# Patient Record
Sex: Female | Born: 1940 | Race: White | Hispanic: No | Smoking: Former smoker
Health system: Southern US, Community
[De-identification: ages and names within clinical notes are randomized; demographics above are authoritative.]

## PROBLEM LIST (undated history)

## (undated) DIAGNOSIS — F0390 Unspecified dementia without behavioral disturbance: Secondary | ICD-10-CM

## (undated) DIAGNOSIS — R519 Headache, unspecified: Secondary | ICD-10-CM

## (undated) DIAGNOSIS — N390 Urinary tract infection, site not specified: Secondary | ICD-10-CM

## (undated) DIAGNOSIS — C801 Malignant (primary) neoplasm, unspecified: Secondary | ICD-10-CM

## (undated) DIAGNOSIS — R490 Dysphonia: Secondary | ICD-10-CM

## (undated) DIAGNOSIS — M549 Dorsalgia, unspecified: Secondary | ICD-10-CM

## (undated) DIAGNOSIS — G8929 Other chronic pain: Secondary | ICD-10-CM

## (undated) DIAGNOSIS — I10 Essential (primary) hypertension: Secondary | ICD-10-CM

## (undated) DIAGNOSIS — M1990 Unspecified osteoarthritis, unspecified site: Secondary | ICD-10-CM

## (undated) HISTORY — PX: SHOULDER ARTHROSCOPY WITH OPEN ROTATOR CUFF REPAIR: SHX6092

## (undated) HISTORY — PX: LUMBAR FUSION: SHX111

## (undated) HISTORY — PX: DILATION AND CURETTAGE OF UTERUS: SHX78

---

## 1998-01-08 ENCOUNTER — Other Ambulatory Visit: Admission: RE | Admit: 1998-01-08 | Discharge: 1998-01-08 | Payer: Self-pay | Admitting: Obstetrics and Gynecology

## 1999-02-22 ENCOUNTER — Other Ambulatory Visit: Admission: RE | Admit: 1999-02-22 | Discharge: 1999-02-22 | Payer: Self-pay | Admitting: Obstetrics and Gynecology

## 1999-12-03 ENCOUNTER — Encounter: Payer: Self-pay | Admitting: Obstetrics and Gynecology

## 1999-12-03 ENCOUNTER — Encounter: Admission: RE | Admit: 1999-12-03 | Discharge: 1999-12-03 | Payer: Self-pay | Admitting: Obstetrics and Gynecology

## 2000-05-06 ENCOUNTER — Ambulatory Visit (HOSPITAL_COMMUNITY): Admission: RE | Admit: 2000-05-06 | Discharge: 2000-05-06 | Payer: Self-pay | Admitting: Gastroenterology

## 2000-06-25 ENCOUNTER — Encounter: Payer: Self-pay | Admitting: Family Medicine

## 2000-06-25 ENCOUNTER — Encounter: Admission: RE | Admit: 2000-06-25 | Discharge: 2000-06-25 | Payer: Self-pay | Admitting: Family Medicine

## 2000-12-30 ENCOUNTER — Encounter: Payer: Self-pay | Admitting: Family Medicine

## 2000-12-30 ENCOUNTER — Encounter: Admission: RE | Admit: 2000-12-30 | Discharge: 2000-12-30 | Payer: Self-pay | Admitting: Family Medicine

## 2001-02-16 ENCOUNTER — Encounter: Admission: RE | Admit: 2001-02-16 | Discharge: 2001-02-16 | Payer: Self-pay | Admitting: Orthopedic Surgery

## 2001-02-16 ENCOUNTER — Encounter: Payer: Self-pay | Admitting: Orthopedic Surgery

## 2001-03-03 ENCOUNTER — Encounter: Payer: Self-pay | Admitting: Orthopedic Surgery

## 2001-03-03 ENCOUNTER — Encounter: Admission: RE | Admit: 2001-03-03 | Discharge: 2001-03-03 | Payer: Self-pay | Admitting: Orthopedic Surgery

## 2001-03-17 ENCOUNTER — Encounter: Payer: Self-pay | Admitting: Orthopedic Surgery

## 2001-03-17 ENCOUNTER — Encounter: Admission: RE | Admit: 2001-03-17 | Discharge: 2001-03-17 | Payer: Self-pay | Admitting: Orthopedic Surgery

## 2001-04-08 ENCOUNTER — Other Ambulatory Visit: Admission: RE | Admit: 2001-04-08 | Discharge: 2001-04-08 | Payer: Self-pay | Admitting: Obstetrics and Gynecology

## 2002-02-02 ENCOUNTER — Encounter: Admission: RE | Admit: 2002-02-02 | Discharge: 2002-02-02 | Payer: Self-pay | Admitting: Family Medicine

## 2002-02-02 ENCOUNTER — Encounter: Payer: Self-pay | Admitting: Family Medicine

## 2002-10-29 ENCOUNTER — Ambulatory Visit (HOSPITAL_COMMUNITY): Admission: RE | Admit: 2002-10-29 | Discharge: 2002-10-29 | Payer: Self-pay | Admitting: *Deleted

## 2002-11-08 ENCOUNTER — Encounter: Admission: RE | Admit: 2002-11-08 | Discharge: 2002-11-08 | Payer: Self-pay | Admitting: Family Medicine

## 2002-11-08 ENCOUNTER — Encounter: Payer: Self-pay | Admitting: Family Medicine

## 2003-01-25 ENCOUNTER — Encounter: Payer: Self-pay | Admitting: Emergency Medicine

## 2003-01-25 ENCOUNTER — Emergency Department (HOSPITAL_COMMUNITY): Admission: AD | Admit: 2003-01-25 | Discharge: 2003-01-25 | Payer: Self-pay | Admitting: Emergency Medicine

## 2003-02-07 ENCOUNTER — Encounter: Admission: RE | Admit: 2003-02-07 | Discharge: 2003-02-07 | Payer: Self-pay | Admitting: Family Medicine

## 2003-02-07 ENCOUNTER — Encounter: Payer: Self-pay | Admitting: Family Medicine

## 2003-06-28 ENCOUNTER — Ambulatory Visit (HOSPITAL_COMMUNITY): Admission: RE | Admit: 2003-06-28 | Discharge: 2003-06-28 | Payer: Self-pay | Admitting: Gastroenterology

## 2003-11-22 ENCOUNTER — Encounter: Admission: RE | Admit: 2003-11-22 | Discharge: 2004-02-20 | Payer: Self-pay | Admitting: Family Medicine

## 2004-02-13 ENCOUNTER — Encounter: Admission: RE | Admit: 2004-02-13 | Discharge: 2004-02-13 | Payer: Self-pay | Admitting: Family Medicine

## 2004-02-20 ENCOUNTER — Encounter: Admission: RE | Admit: 2004-02-20 | Discharge: 2004-05-20 | Payer: Self-pay | Admitting: Family Medicine

## 2004-07-31 ENCOUNTER — Other Ambulatory Visit: Admission: RE | Admit: 2004-07-31 | Discharge: 2004-07-31 | Payer: Self-pay | Admitting: Family Medicine

## 2005-03-14 ENCOUNTER — Encounter: Admission: RE | Admit: 2005-03-14 | Discharge: 2005-03-14 | Payer: Self-pay | Admitting: Family Medicine

## 2006-03-18 ENCOUNTER — Encounter: Admission: RE | Admit: 2006-03-18 | Discharge: 2006-03-18 | Payer: Self-pay | Admitting: Family Medicine

## 2006-06-24 ENCOUNTER — Encounter: Admission: RE | Admit: 2006-06-24 | Discharge: 2006-06-24 | Payer: Self-pay | Admitting: Orthopedic Surgery

## 2006-11-24 ENCOUNTER — Other Ambulatory Visit: Admission: RE | Admit: 2006-11-24 | Discharge: 2006-11-24 | Payer: Self-pay | Admitting: Obstetrics and Gynecology

## 2007-06-11 ENCOUNTER — Encounter: Admission: RE | Admit: 2007-06-11 | Discharge: 2007-06-11 | Payer: Self-pay | Admitting: Family Medicine

## 2008-02-23 ENCOUNTER — Encounter: Admission: RE | Admit: 2008-02-23 | Discharge: 2008-02-23 | Payer: Self-pay | Admitting: Family Medicine

## 2008-07-03 ENCOUNTER — Encounter: Admission: RE | Admit: 2008-07-03 | Discharge: 2008-07-03 | Payer: Self-pay | Admitting: Family Medicine

## 2009-08-24 ENCOUNTER — Encounter: Admission: RE | Admit: 2009-08-24 | Discharge: 2009-08-24 | Payer: Self-pay | Admitting: Family Medicine

## 2010-08-27 ENCOUNTER — Encounter: Admission: RE | Admit: 2010-08-27 | Discharge: 2010-08-27 | Payer: Self-pay | Admitting: Family Medicine

## 2011-02-28 NOTE — Op Note (Signed)
   NAME:  Tina Price, Tina Price                        ACCOUNT NO.:  [id number]   MEDICAL RECORD NO.:  [id number]                   PATIENT TYPE:  AMB   LOCATION:  ENDO                                 FACILITY:  [facility]   PHYSICIAN:  John C. Madilyn Fireman, M.D.                 DATE OF BIRTH:  [birth year]   DATE OF PROCEDURE:  DATE OF DISCHARGE:                                 OPERATIVE REPORT   PROCEDURE:  Colonoscopy.   INDICATIONS FOR PROCEDURE:  History of adenomatous colon polyps and a family  history of colon cancer in one first and one second degree relative.   DESCRIPTION OF PROCEDURE:  The patient was placed in the left lateral  decubitus position then placed on the pulse monitor with continuous low flow  oxygen delivered by nasal cannula. She was sedated with 100 mcg IV fentanyl  and 8 mg IV Versed. The Olympus video colonoscope was inserted into the  rectum and advanced to the cecum, confirmed by transillumination at  McBurney's point and visualization at the ileocecal valve and appendiceal  orifice. The prep was excellent. The cecum, ascending, transverse and  descending colon all appeared normal with no masses, polyps, diverticula or  other mucosal abnormalities. Within the sigmoid colon, there were seen a few  scattered diverticula but no other abnormalities. The rectum appeared normal  and retroflexed view of the anus revealed no obvious internal hemorrhoids.  The scope was then withdrawn and the patient returned to the recovery room  in stable condition. She tolerated the procedure well and there were no  immediate complications.   IMPRESSION:  Scattered diverticula otherwise normal colonoscopy.   PLAN:  Repeat colonoscopy in five years based on her personal history of  polyps and family history of colon cancer.                                               John C. Madilyn Fireman, M.D.    [facility]MED[facility]  D:  06/28/2003  T:  06/28/2003  Job:  [id number]   cc:   Miguel Aschoff, M.D.  [address]  [place]  [place]  [phone]  Fax: [phone]

## 2011-07-11 ENCOUNTER — Other Ambulatory Visit: Payer: Self-pay | Admitting: Family Medicine

## 2011-07-11 ENCOUNTER — Other Ambulatory Visit (HOSPITAL_COMMUNITY)
Admission: RE | Admit: 2011-07-11 | Discharge: 2011-07-11 | Disposition: A | Discharge status: Home / Self Care | Payer: Medicare Other | Source: Ambulatory Visit | Attending: Family Medicine | Admitting: Family Medicine

## 2011-07-11 DIAGNOSIS — Z124 Encounter for screening for malignant neoplasm of cervix: Secondary | ICD-10-CM | POA: Insufficient documentation

## 2011-09-16 ENCOUNTER — Other Ambulatory Visit: Payer: Self-pay | Admitting: Family Medicine

## 2011-09-16 DIAGNOSIS — Z1231 Encounter for screening mammogram for malignant neoplasm of breast: Secondary | ICD-10-CM

## 2011-10-22 ENCOUNTER — Ambulatory Visit: Payer: Medicare Other

## 2011-11-25 ENCOUNTER — Ambulatory Visit
Admission: RE | Admit: 2011-11-25 | Discharge: 2011-11-25 | Disposition: A | Discharge status: Home / Self Care | Payer: Medicare Other | Source: Ambulatory Visit | Attending: Family Medicine | Admitting: Family Medicine

## 2011-11-25 DIAGNOSIS — Z1231 Encounter for screening mammogram for malignant neoplasm of breast: Secondary | ICD-10-CM

## 2012-08-22 ENCOUNTER — Encounter (HOSPITAL_COMMUNITY): Payer: Self-pay | Admitting: Emergency Medicine

## 2012-08-22 ENCOUNTER — Emergency Department (HOSPITAL_COMMUNITY)
Admission: EM | Admit: 2012-08-22 | Discharge: 2012-08-22 | Disposition: A | Discharge status: Home / Self Care | Payer: Medicare Other | Attending: Emergency Medicine | Admitting: Emergency Medicine

## 2012-08-22 DIAGNOSIS — N390 Urinary tract infection, site not specified: Secondary | ICD-10-CM

## 2012-08-22 DIAGNOSIS — R350 Frequency of micturition: Secondary | ICD-10-CM | POA: Insufficient documentation

## 2012-08-22 DIAGNOSIS — R269 Unspecified abnormalities of gait and mobility: Secondary | ICD-10-CM | POA: Insufficient documentation

## 2012-08-22 DIAGNOSIS — M5416 Radiculopathy, lumbar region: Secondary | ICD-10-CM

## 2012-08-22 DIAGNOSIS — R5383 Other fatigue: Secondary | ICD-10-CM | POA: Insufficient documentation

## 2012-08-22 DIAGNOSIS — Z79899 Other long term (current) drug therapy: Secondary | ICD-10-CM | POA: Insufficient documentation

## 2012-08-22 DIAGNOSIS — M129 Arthropathy, unspecified: Secondary | ICD-10-CM | POA: Insufficient documentation

## 2012-08-22 DIAGNOSIS — R5381 Other malaise: Secondary | ICD-10-CM | POA: Insufficient documentation

## 2012-08-22 DIAGNOSIS — R209 Unspecified disturbances of skin sensation: Secondary | ICD-10-CM | POA: Insufficient documentation

## 2012-08-22 DIAGNOSIS — G8929 Other chronic pain: Secondary | ICD-10-CM

## 2012-08-22 HISTORY — DX: Other chronic pain: G89.29

## 2012-08-22 HISTORY — DX: Dorsalgia, unspecified: M54.9

## 2012-08-22 HISTORY — DX: Unspecified osteoarthritis, unspecified site: M19.90

## 2012-08-22 LAB — URINALYSIS, ROUTINE W REFLEX MICROSCOPIC
Glucose, UA: NEGATIVE mg/dL
pH: 6.5 (ref 5.0–8.0)

## 2012-08-22 LAB — URINE MICROSCOPIC-ADD ON

## 2012-08-22 MED ORDER — HYDROMORPHONE HCL PF 1 MG/ML IJ SOLN
1.0000 mg | Freq: Once | INTRAMUSCULAR | Status: AC
Start: 2012-08-22 — End: 2012-08-22
  Administered 2012-08-22: 1 mg via INTRAMUSCULAR
  Filled 2012-08-22: qty 1

## 2012-08-22 MED ORDER — NITROFURANTOIN MACROCRYSTAL 100 MG PO CAPS
100.0000 mg | ORAL_CAPSULE | Freq: Four times a day (QID) | ORAL | Status: DC
  Administered 2012-08-22: 100 mg via ORAL
  Filled 2012-08-22 (×2): qty 1

## 2012-08-22 MED ORDER — DIAZEPAM 5 MG PO TABS
5.0000 mg | ORAL_TABLET | Freq: Once | ORAL | Status: AC
  Administered 2012-08-22: 5 mg via ORAL
  Filled 2012-08-22: qty 1

## 2012-08-22 MED ORDER — NITROFURANTOIN MACROCRYSTAL 100 MG PO CAPS: 100.0000 mg | ORAL_CAPSULE | Freq: Four times a day (QID) | ORAL | Status: DC

## 2012-08-22 NOTE — ED Provider Notes (Signed)
Complains of low back pain chronic for several years. Pain similar to back pain which he has been experiencing for years only more severe recently treated with prednisone taper. Patient also taking Vicodin one every 8 hours without adequate pain relief  Doug Sou, MD 08/22/12 2137

## 2012-08-22 NOTE — ED Provider Notes (Signed)
History     CSN: [id number]  Arrival date & time 08/22/12  1851   First MD Initiated Contact with Patient 08/22/12 2052      Chief Complaint  Patient presents with  . Back Pain    (Consider location/radiation/quality/duration/timing/severity/associated sxs/prior treatment) HPI Comments: Pateint with chronic back pain has been worse over the past 10 days was seen by Ortho and started on 12 day prednisone taper as well as Hydrocodone Initial she though she was getting  better but pain has increased over the past 2 days since starting 20 mg Prednisone. She has not lost bowel or bladder control but states her L leg is weaker than normal, but is able to walk with a cane.  Patient is a 71 y.o. female presenting with back pain. The history is provided by the patient.  Back Pain  This is a chronic problem. The current episode started more than 1 week ago. The problem occurs constantly. The problem has been gradually worsening. The pain is associated with no known injury. The pain is present in the lumbar spine. The quality of the pain is described as aching. The pain radiates to the left thigh. The pain is at a severity of 8/10. The pain is moderate. The symptoms are aggravated by certain positions. Associated symptoms include numbness and weakness. Pertinent negatives include no fever, no abdominal pain and no dysuria.    Past Medical History  Diagnosis Date  . Back pain, chronic   . Arthritis     Past Surgical History  Procedure Date  . Sholder arthroscopy with open rotator cuff repair   . Dilation and curettage of uterus     History reviewed. No pertinent family history.  History  Substance Use Topics  . Smoking status: Never Smoker   . Smokeless tobacco: Not on file  . Alcohol Use: No    OB History    Grav Para Term Preterm Abortions TAB SAB Ect Mult Living                  Review of Systems  Constitutional: Negative for fever and chills.  HENT: Negative.     Cardiovascular: Negative for leg swelling.  Gastrointestinal: Negative for abdominal pain, diarrhea and rectal pain.  Genitourinary: Positive for frequency. Negative for dysuria and flank pain.  Musculoskeletal: Positive for back pain and gait problem. Negative for joint swelling.  Skin: Negative for rash and wound.  Neurological: Positive for weakness and numbness.  Hematological: Negative.   Psychiatric/Behavioral: Negative.   All other systems reviewed and are negative.    Allergies  Penicillins and Sulfa antibiotics  Home Medications   Current Outpatient Rx  Name  Route  Sig  Dispense  Refill  . ASPIRIN 81 MG PO TABS   Oral   Take 81 mg by mouth daily.         Marland Kitchen DIAZEPAM 5 MG PO TABS   Oral   Take 5 mg by mouth every 6 (six) hours as needed. Vertigo         . HYDROCHLOROTHIAZIDE 12.5 MG PO CAPS   Oral   Take 12.5 mg by mouth daily.         Marland Kitchen HYDROCODONE-ACETAMINOPHEN 5-325 MG PO TABS   Oral   Take 1 tablet by mouth every 6 (six) hours as needed. Pain         . MELOXICAM 7.5 MG PO TABS   Oral   Take 7.5 mg by mouth daily.         Marland Kitchen  PREDNISONE (PAK) 5 MG PO TABS   Oral   Take 5 mg by mouth daily.         . SERTRALINE HCL 50 MG PO TABS   Oral   Take 50 mg by mouth daily.         Marland Kitchen SIMVASTATIN 40 MG PO TABS   Oral   Take 40 mg by mouth every evening.         Marland Kitchen VERAPAMIL HCL ER 240 MG PO TBCR   Oral   Take 240 mg by mouth at bedtime.         Marland Kitchen NITROFURANTOIN MACROCRYSTAL 100 MG PO CAPS   Oral   Take 1 capsule (100 mg total) by mouth 4 (four) times daily.   19 capsule   0     BP 129/70  Pulse 55  Temp 97.5 F (36.4 C) (Oral)  Resp 12  Wt 160 lb (72.576 kg)  SpO2 98%  Physical Exam  Constitutional: She is oriented to person, place, and time. She appears well-developed and well-nourished.  HENT:  Head: Normocephalic.  Left Ear: External ear normal.  Eyes: Pupils are equal, round, and reactive to light.  Neck: Normal range of  motion.  Cardiovascular: Normal rate and regular rhythm.   Pulmonary/Chest: Effort normal and breath sounds normal.  Abdominal: Soft. She exhibits no distension. There is no tenderness.  Musculoskeletal: Normal range of motion. She exhibits no edema and no tenderness.       Lumbar back: She exhibits tenderness and pain. She exhibits no bony tenderness, no swelling and no spasm.       Back:  Neurological: She is alert and oriented to person, place, and time.  Skin: Skin is warm and dry. No rash noted.    ED Course  Procedures (including critical care time)  Labs Reviewed  URINALYSIS, ROUTINE W REFLEX MICROSCOPIC - Abnormal; Notable for the following:    Hgb urine dipstick MODERATE (*)     Nitrite POSITIVE (*)     Leukocytes, UA SMALL (*)     All other components within normal limits  URINE MICROSCOPIC-ADD ON - Abnormal; Notable for the following:    Bacteria, UA MANY (*)     All other components within normal limits  URINE CULTURE   No results found.   1. UTI (lower urinary tract infection)   2. Chronic radicular low back pain       MDM  Pain control will check UA due to frequency  Patient feeling better after IM medication UA positive for nitrates and bacteria will treat with Macrobid due to allergy profile        Arman Filter, NP 08/23/12 [phone]

## 2012-08-22 NOTE — ED Notes (Signed)
Patient reports that she started to have increased pain to her back with numbness and tingling that woke her up this morning. Patient reports that this is not her normal back pain

## 2012-08-23 ENCOUNTER — Ambulatory Visit (HOSPITAL_COMMUNITY)
Admission: RE | Admit: 2012-08-23 | Discharge: 2012-08-23 | Disposition: A | Discharge status: Home / Self Care | Payer: Medicare Other | Source: Ambulatory Visit | Attending: Emergency Medicine | Admitting: Emergency Medicine

## 2012-08-23 DIAGNOSIS — M5137 Other intervertebral disc degeneration, lumbosacral region: Secondary | ICD-10-CM | POA: Insufficient documentation

## 2012-08-23 DIAGNOSIS — M4120 Other idiopathic scoliosis, site unspecified: Secondary | ICD-10-CM | POA: Insufficient documentation

## 2012-08-23 DIAGNOSIS — M51379 Other intervertebral disc degeneration, lumbosacral region without mention of lumbar back pain or lower extremity pain: Secondary | ICD-10-CM | POA: Insufficient documentation

## 2012-08-24 NOTE — ED Provider Notes (Signed)
Medical screening examination/treatment/procedure(s) were conducted as a shared visit with non-physician practitioner(s) and myself.  I personally evaluated the patient during the encounter  Doug Sou, MD 08/24/12 [phone]

## 2012-08-25 LAB — URINE CULTURE

## 2012-08-26 NOTE — ED Notes (Signed)
+   Urine] Patient treated with Macrobid-sensitive to same-chart appended per protocol MD. 

## 2012-12-09 ENCOUNTER — Other Ambulatory Visit: Payer: Self-pay

## 2012-12-09 DIAGNOSIS — Z1231 Encounter for screening mammogram for malignant neoplasm of breast: Secondary | ICD-10-CM

## 2013-01-03 ENCOUNTER — Ambulatory Visit
Admission: RE | Admit: 2013-01-03 | Discharge: 2013-01-03 | Disposition: A | Discharge status: Home / Self Care | Payer: Medicare Other | Source: Ambulatory Visit

## 2013-01-03 DIAGNOSIS — Z1231 Encounter for screening mammogram for malignant neoplasm of breast: Secondary | ICD-10-CM

## 2013-11-24 ENCOUNTER — Other Ambulatory Visit: Payer: Self-pay | Admitting: Gastroenterology

## 2014-01-12 ENCOUNTER — Other Ambulatory Visit: Payer: Self-pay

## 2014-01-12 DIAGNOSIS — Z1231 Encounter for screening mammogram for malignant neoplasm of breast: Secondary | ICD-10-CM

## 2014-01-23 ENCOUNTER — Ambulatory Visit
Admission: RE | Admit: 2014-01-23 | Discharge: 2014-01-23 | Disposition: A | Discharge status: Home / Self Care | Payer: Medicare Other | Source: Ambulatory Visit

## 2014-01-23 DIAGNOSIS — Z1231 Encounter for screening mammogram for malignant neoplasm of breast: Secondary | ICD-10-CM

## 2014-09-01 ENCOUNTER — Other Ambulatory Visit: Payer: Self-pay | Admitting: Rehabilitation

## 2014-09-01 DIAGNOSIS — M5416 Radiculopathy, lumbar region: Secondary | ICD-10-CM

## 2014-09-04 ENCOUNTER — Ambulatory Visit
Admission: RE | Admit: 2014-09-04 | Discharge: 2014-09-04 | Disposition: A | Discharge status: Home / Self Care | Payer: 59 | Source: Ambulatory Visit | Attending: Rehabilitation | Admitting: Rehabilitation

## 2014-09-04 DIAGNOSIS — M5416 Radiculopathy, lumbar region: Secondary | ICD-10-CM

## 2015-01-08 ENCOUNTER — Other Ambulatory Visit: Payer: Self-pay

## 2015-01-08 MED ORDER — DIAZEPAM 5 MG PO TABS: ORAL_TABLET | ORAL | Status: DC

## 2015-01-08 MED ORDER — HYDROCODONE-ACETAMINOPHEN 10-325 MG PO TABS: ORAL_TABLET | ORAL | Status: DC

## 2015-01-08 NOTE — Telephone Encounter (Signed)
[facility] 

## 2015-01-09 ENCOUNTER — Non-Acute Institutional Stay (SKILLED_NURSING_FACILITY): Payer: Medicare Other | Admitting: Internal Medicine

## 2015-01-09 DIAGNOSIS — J383 Other diseases of vocal cords: Secondary | ICD-10-CM | POA: Diagnosis not present

## 2015-01-09 DIAGNOSIS — N3090 Cystitis, unspecified without hematuria: Secondary | ICD-10-CM

## 2015-01-09 DIAGNOSIS — G43009 Migraine without aura, not intractable, without status migrainosus: Secondary | ICD-10-CM

## 2015-01-09 DIAGNOSIS — N3080 Other cystitis without hematuria: Secondary | ICD-10-CM | POA: Diagnosis not present

## 2015-01-09 DIAGNOSIS — I10 Essential (primary) hypertension: Secondary | ICD-10-CM

## 2015-01-09 DIAGNOSIS — G629 Polyneuropathy, unspecified: Secondary | ICD-10-CM

## 2015-01-09 DIAGNOSIS — M47817 Spondylosis without myelopathy or radiculopathy, lumbosacral region: Secondary | ICD-10-CM | POA: Diagnosis not present

## 2015-01-09 DIAGNOSIS — M4716 Other spondylosis with myelopathy, lumbar region: Secondary | ICD-10-CM

## 2015-01-09 DIAGNOSIS — E785 Hyperlipidemia, unspecified: Secondary | ICD-10-CM

## 2015-01-09 NOTE — Progress Notes (Signed)
Patient ID: Tina Price, female   DOB: 1941/09/27, 74 y.o.   MRN: [id number]     [facility]   PCP: No primary care provider on file.  Code Status: full code  Allergies  Allergen Reactions  . Penicillins Rash  . Sulfa Antibiotics Rash    Chief Complaint  Patient presents with  . New Admit To SNF     HPI:  74 year old patient is here for short term rehabilitation post hospital admission from 01/02/15-01/05/15 with lumbosacral spondylosis with myelopathy and scoliosis. She underwent L4-S1 posterior spinal fusion with instrumentation and incidental durotomy repair.  She has PMH of HTN, kyphoscoilosis, HLD, OA, spasmodic dysphonia and recurrent cystitis. She is seen in her room today. Her back pain is under control. Denies muscle spasm. Denies any concerns  Review of Systems:  Constitutional: Negative for fever, chills, malaise/fatigue and diaphoresis.  HENT: Negative for headache, congestion Eyes: Negative for eye pain, blurred vision, double vision and discharge.  Respiratory: Negative for cough, shortness of breath and wheezing.   Cardiovascular: Negative for chest pain, palpitations, leg swelling.  Gastrointestinal: Negative for heartburn, nausea, vomiting, abdominal pain. Appetite is fair. had a bowel movement yesterday Genitourinary: Negative for dysuria. Has overflow incontinence Musculoskeletal: Negative for falls. Has arthritis and wears brace in right wrist Skin: Negative for itching, rash.  Neurological: Negative for dizziness, tingling, focal weakness Psychiatric/Behavioral: Negative for depression  Past Medical History  Diagnosis Date  . Back pain, chronic   . Arthritis    Past Surgical History  Procedure Laterality Date  . Shoulder arthroscopy with open rotator cuff repair    . Dilation and curettage of uterus     Social History:   reports that she has never smoked. She does not have any smokeless tobacco history on  file. She reports that she does not drink alcohol or use illicit drugs.  No family history on file.  Medications: Patient's Medications  New Prescriptions   No medications on file  Previous Medications   BACLOFEN (LIORESAL) 10 MG TABLET    Take 10 mg by mouth 3 (three) times daily.   CEPHALEXIN (KEFLEX) 250 MG CAPSULE    Take 250 mg by mouth 3 (three) times daily.   DIAZEPAM (VALIUM) 5 MG TABLET    Take one tablet by mouth three times a day as needed   GABAPENTIN (NEURONTIN) 100 MG CAPSULE    Take 100 mg by mouth daily as needed.   GABAPENTIN (NEURONTIN) 100 MG CAPSULE    Take 200 mg by mouth at bedtime.   HYDROCHLOROTHIAZIDE (MICROZIDE) 12.5 MG CAPSULE    Take 12.5 mg by mouth daily.   HYDROCODONE-ACETAMINOPHEN (NORCO) 10-325 MG PER TABLET    Take 1 tablet by mouth every 8 (eight) hours as needed.   ONDANSETRON (ZOFRAN-ODT) 8 MG DISINTEGRATING TABLET    Take 8 mg by mouth every 8 (eight) hours as needed for nausea or vomiting.   SIMVASTATIN (ZOCOR) 40 MG TABLET    Take 40 mg by mouth every evening.   TOPIRAMATE (TOPAMAX) 25 MG TABLET    Take 25 mg by mouth at bedtime as needed.   VERAPAMIL (CALAN-SR) 240 MG CR TABLET    Take 240 mg by mouth at bedtime.  Modified Medications   No medications on file  Discontinued Medications   ASPIRIN 81 MG TABLET    Take 81 mg by mouth daily.   HYDROCODONE-ACETAMINOPHEN (NORCO) 10-325 MG PER TABLET    Take one  tablet by mouth every 6 hours as needed while awake as needed *DO NOT EXCEED 4GM OF TYLENOL IN 24 HOURS*   MELOXICAM (MOBIC) 7.5 MG TABLET    Take 7.5 mg by mouth daily.   NITROFURANTOIN (MACRODANTIN) 100 MG CAPSULE    Take 1 capsule (100 mg total) by mouth 4 (four) times daily.   PREDNISONE (STERAPRED UNI-PAK) 5 MG TABS    Take 5 mg by mouth daily.   SERTRALINE (ZOLOFT) 50 MG TABLET    Take 50 mg by mouth daily.     Physical Exam: Filed Vitals:   01/09/15 1042  BP: 122/68  Pulse: 66  Temp: 98.2 F (36.8 C)  Resp: 18  SpO2: 96%     General- elderly female, in no acute distress Head- normocephalic, atraumatic Throat- moist mucus membrane, has dysphonia Eyes- PERRLA, EOMI, no pallor, no icterus, no discharge, normal conjunctiva, normal sclera Neck- no cervical lymphadenopathy Cardiovascular- normal s1,s2, no murmurs, palpable dorsalis pedis and radial pulses, trace left leg edema Respiratory- bilateral clear to auscultation, no wheeze, no rhonchi, no crackles, no use of accessory muscles Abdomen- bowel sounds present, soft, non tender Musculoskeletal- able to move all 4 extremities, surgical incision in lower back area open to air and healing well. Some right sided paraspinal tenderness on exam, has kyphosis and scoliosis, right wrist in soft brace Neurological- no focal deficit Psychiatry- alert and oriented to person, place and time, normal mood and affect    Labs reviewed: see discharge summary  Assessment/Plan  Lumbosacral spondylosis S/p fusion. Surgical incision looks good. Continue nroco 10-325 mg q8h prn pain. Continue baclofen 10 mg tid for muscle spasm  Recurrent cystitis Currently asymptomatic. On chronic keflex for prophylaxis  Neuropathy Continue neurontin 200 mg daily with additional 100 mg qd prn for pain  HTN bp stable, continue hctz 12.5 mg daily and verapamil 240 mg daily  HLD Continue zocor 40 mg daily  Migraine No headache at present, continue prn topamax  Spasmodic dysphonia Stable, continue valium tid prn and baclofen tid scheduled  Goals of care: short term rehabilitation    Labs/tests ordered- cbc, cmp  Family/ staff Communication: reviewed care plan with patient and nursing supervisor    Blanchie Serve, MD  [facility] [phone] (Monday-Friday 8 am - 5 pm) [phone] (afterhours)

## 2015-01-10 DIAGNOSIS — F411 Generalized anxiety disorder: Secondary | ICD-10-CM | POA: Insufficient documentation

## 2015-01-10 DIAGNOSIS — M4716 Other spondylosis with myelopathy, lumbar region: Secondary | ICD-10-CM | POA: Insufficient documentation

## 2015-01-10 DIAGNOSIS — J383 Other diseases of vocal cords: Secondary | ICD-10-CM | POA: Insufficient documentation

## 2015-01-10 DIAGNOSIS — E785 Hyperlipidemia, unspecified: Secondary | ICD-10-CM | POA: Insufficient documentation

## 2015-01-10 DIAGNOSIS — G43009 Migraine without aura, not intractable, without status migrainosus: Secondary | ICD-10-CM | POA: Insufficient documentation

## 2015-01-10 DIAGNOSIS — N3090 Cystitis, unspecified without hematuria: Secondary | ICD-10-CM | POA: Insufficient documentation

## 2015-01-10 DIAGNOSIS — G629 Polyneuropathy, unspecified: Secondary | ICD-10-CM | POA: Insufficient documentation

## 2015-01-10 DIAGNOSIS — I10 Essential (primary) hypertension: Secondary | ICD-10-CM | POA: Insufficient documentation

## 2015-02-14 ENCOUNTER — Encounter: Payer: Self-pay | Admitting: Adult Health

## 2015-02-14 ENCOUNTER — Non-Acute Institutional Stay (SKILLED_NURSING_FACILITY): Payer: Medicare Other | Admitting: Adult Health

## 2015-02-14 DIAGNOSIS — K5900 Constipation, unspecified: Secondary | ICD-10-CM | POA: Diagnosis not present

## 2015-02-14 DIAGNOSIS — M47817 Spondylosis without myelopathy or radiculopathy, lumbosacral region: Secondary | ICD-10-CM | POA: Diagnosis not present

## 2015-02-14 DIAGNOSIS — E785 Hyperlipidemia, unspecified: Secondary | ICD-10-CM

## 2015-02-14 DIAGNOSIS — J383 Other diseases of vocal cords: Secondary | ICD-10-CM

## 2015-02-14 DIAGNOSIS — G629 Polyneuropathy, unspecified: Secondary | ICD-10-CM | POA: Diagnosis not present

## 2015-02-14 DIAGNOSIS — I10 Essential (primary) hypertension: Secondary | ICD-10-CM

## 2015-02-14 DIAGNOSIS — N3080 Other cystitis without hematuria: Secondary | ICD-10-CM

## 2015-02-14 DIAGNOSIS — N3090 Cystitis, unspecified without hematuria: Secondary | ICD-10-CM

## 2015-02-14 DIAGNOSIS — G43009 Migraine without aura, not intractable, without status migrainosus: Secondary | ICD-10-CM | POA: Diagnosis not present

## 2015-02-14 DIAGNOSIS — M4716 Other spondylosis with myelopathy, lumbar region: Secondary | ICD-10-CM

## 2015-02-14 NOTE — Progress Notes (Signed)
Patient ID: Tina Price, female   DOB: Sep 10, 1941, 74 y.o.   MRN: [id number]   02/14/2015  Facility:  [facility] Home Location:  [place] Room Number: 509-P LEVEL OF CARE:  SNF (31)  Chief Complaint  Patient presents with  . Discharge Note    Lumbosacral spondylosis S/P L4-S1 spinal fusion with instrumentation and incidental durotomy, hypertension, recurrent cystitis, neuropathy, hyperlipidemia, constipation and migraine    HISTORY OF PRESENT ILLNESS:  This is a 74 year old female who is for discharge home with home health PT for strengthening, endurance and gait and OT for self care and home management tasks. DME: Rolling walker and 3 in 1 commode. She has been admitted to [facility] on 01/05/15 from [facility]. She has PMH of hypertension, kyphoscoliosis, hyperlipidemia, osteoarthritis, spasmodic dysphonia and recurrent cystitis. She had L4-S1 spinal fusion with instrumentation and incidental durotomy repair on 01/02/15.  Patient was admitted to this facility for short-term rehabilitation after the patient's recent hospitalization.  Patient has completed SNF rehabilitation and therapy has cleared the patient for discharge.  PAST MEDICAL HISTORY:  Past Medical History  Diagnosis Date  . Back pain, chronic   . Arthritis     CURRENT MEDICATIONS: Reviewed per MAR/see medication list  Allergies  Allergen Reactions  . Penicillins Rash  . Sulfa Antibiotics Rash    REVIEW OF SYSTEMS:  GENERAL: no change in appetite, no fatigue, no weight changes, no fever, chills or weakness RESPIRATORY: no cough, SOB, DOE, wheezing, hemoptysis CARDIAC: no chest pain, edema or palpitations GI: no abdominal pain, diarrhea, constipation, heart burn, nausea or vomiting  PHYSICAL EXAMINATION  GENERAL: no acute distress, normal body habitus SKIN:  Midline lower back surgical site is healed EYES: conjunctivae normal, sclerae normal, normal eye  lids NECK: supple, trachea midline, no neck masses, no thyroid tenderness, no thyromegaly LYMPHATICS: no LAN in the neck, no supraclavicular LAN RESPIRATORY: breathing is even & unlabored, BS CTAB CARDIAC: RRR, no murmur,no extra heart sounds, no edema GI: abdomen soft, normal BS, no masses, no tenderness, no hepatomegaly, no splenomegaly EXTREMITIES: she is able to move X 4 extremities; walks with walker PSYCHIATRIC: the patient is alert & oriented to person, affect & behavior appropriate  LABS/RADIOLOGY: Labs reviewed: 01/05/15  hemoglobin 10.3 hematocrit 30.7 01/04/15  sodium 140 potassium 4.4 glucose 115 BUN 12 creatinine 0.94 calcium 8.0 and INR 6.0 GFR 59 01/03/15  sodium 139 potassium 4.6 glucose 120 BUN 15 creatinine 0.95 calcium 8.0 and INR 8.0   ASSESSMENT/PLAN:  Lumbosacral spondylosis S/P L4-S1 spinal fusion with instrumentation and incidental durotomy repair - for home health PT and OT Hypertension - continue microcide 12.5 mg 1 capsule by mouth daily and Calan SR 240 mg 1 tab by mouth daily Recurrent cystitis - continue Keflex 250 mg 1 by mouth twice a day Neuropathy - continue Neurontin 100 mg take 2 capsules = 200 mg by mouth daily at bedtime and Neurontin 100 mg 1 capsule by mouth daily when necessary Hyperlipidemia - continue Zocor 40 mg 1 by mouth every evening Constipation - continue MiraLAX 17 g by mouth twice a day and senna S2 tabs by mouth twice a day Migraine - continue Topamax 25 mg 1 by mouth daily at bedtime Spasmodic dysphonia - continue Valium 5 mg 1 tab by mouth 3 times a day when necessary   I have filled out patient's discharge paperwork and written prescriptions.  Patient will receive home health PT and OT.  DME provided:  Rolling walker and 3 in one bedside commode  Total discharge time: Greater than 30 minutes  Discharge time involved coordination of the discharge process with Education officer, museum, nursing staff and therapy department. Medical justification  for home health services/DME verified.     [facility], NP [employer] [phone]

## 2015-08-07 ENCOUNTER — Other Ambulatory Visit: Payer: Self-pay

## 2015-08-07 DIAGNOSIS — Z1231 Encounter for screening mammogram for malignant neoplasm of breast: Secondary | ICD-10-CM

## 2015-08-16 ENCOUNTER — Ambulatory Visit
Admission: RE | Admit: 2015-08-16 | Discharge: 2015-08-16 | Disposition: A | Discharge status: Home / Self Care | Payer: Medicare Other | Source: Ambulatory Visit

## 2015-08-16 DIAGNOSIS — Z1231 Encounter for screening mammogram for malignant neoplasm of breast: Secondary | ICD-10-CM

## 2017-05-21 ENCOUNTER — Other Ambulatory Visit: Payer: Self-pay | Admitting: Rehabilitation

## 2017-05-21 DIAGNOSIS — M48061 Spinal stenosis, lumbar region without neurogenic claudication: Secondary | ICD-10-CM

## 2017-06-02 ENCOUNTER — Ambulatory Visit
Admission: RE | Admit: 2017-06-02 | Discharge: 2017-06-02 | Disposition: A | Discharge status: Home / Self Care | Payer: Medicare Other | Source: Ambulatory Visit | Attending: Rehabilitation | Admitting: Rehabilitation

## 2017-06-02 DIAGNOSIS — M48061 Spinal stenosis, lumbar region without neurogenic claudication: Secondary | ICD-10-CM

## 2017-06-02 MED ORDER — GADOBENATE DIMEGLUMINE 529 MG/ML IV SOLN
10.0000 mL | Freq: Once | INTRAVENOUS | Status: AC | PRN
  Administered 2017-06-02: 7 mL via INTRAVENOUS

## 2018-11-01 ENCOUNTER — Other Ambulatory Visit: Payer: Self-pay | Admitting: Family Medicine

## 2018-11-01 DIAGNOSIS — Z1231 Encounter for screening mammogram for malignant neoplasm of breast: Secondary | ICD-10-CM

## 2019-01-24 ENCOUNTER — Ambulatory Visit: Payer: Medicare Other

## 2019-03-10 ENCOUNTER — Ambulatory Visit: Payer: Medicare Other

## 2019-04-27 ENCOUNTER — Ambulatory Visit
Admission: RE | Admit: 2019-04-27 | Discharge: 2019-04-27 | Disposition: A | Discharge status: Home / Self Care | Payer: Medicare Other | Source: Ambulatory Visit | Attending: Family Medicine | Admitting: Family Medicine

## 2019-04-27 ENCOUNTER — Other Ambulatory Visit: Payer: Self-pay

## 2019-04-27 DIAGNOSIS — Z1231 Encounter for screening mammogram for malignant neoplasm of breast: Secondary | ICD-10-CM

## 2019-12-20 ENCOUNTER — Other Ambulatory Visit: Payer: Self-pay | Admitting: Rehabilitation

## 2019-12-20 DIAGNOSIS — M5412 Radiculopathy, cervical region: Secondary | ICD-10-CM

## 2020-01-12 ENCOUNTER — Ambulatory Visit
Admission: RE | Admit: 2020-01-12 | Discharge: 2020-01-12 | Disposition: A | Discharge status: Home / Self Care | Payer: Medicare PPO | Source: Ambulatory Visit | Attending: Rehabilitation | Admitting: Rehabilitation

## 2020-01-12 ENCOUNTER — Other Ambulatory Visit: Payer: Self-pay

## 2020-01-12 DIAGNOSIS — M5412 Radiculopathy, cervical region: Secondary | ICD-10-CM

## 2020-05-22 DIAGNOSIS — N3020 Other chronic cystitis without hematuria: Secondary | ICD-10-CM | POA: Diagnosis not present

## 2020-05-22 DIAGNOSIS — N3281 Overactive bladder: Secondary | ICD-10-CM | POA: Diagnosis not present

## 2020-05-24 DIAGNOSIS — M5412 Radiculopathy, cervical region: Secondary | ICD-10-CM | POA: Diagnosis not present

## 2020-05-24 DIAGNOSIS — M7918 Myalgia, other site: Secondary | ICD-10-CM | POA: Diagnosis not present

## 2020-05-24 DIAGNOSIS — M48061 Spinal stenosis, lumbar region without neurogenic claudication: Secondary | ICD-10-CM | POA: Diagnosis not present

## 2020-05-25 DIAGNOSIS — Z961 Presence of intraocular lens: Secondary | ICD-10-CM | POA: Diagnosis not present

## 2020-05-25 DIAGNOSIS — H524 Presbyopia: Secondary | ICD-10-CM | POA: Diagnosis not present

## 2020-06-05 ENCOUNTER — Other Ambulatory Visit: Payer: Self-pay | Admitting: Family Medicine

## 2020-06-05 DIAGNOSIS — Z1231 Encounter for screening mammogram for malignant neoplasm of breast: Secondary | ICD-10-CM

## 2020-06-11 DIAGNOSIS — R5383 Other fatigue: Secondary | ICD-10-CM | POA: Diagnosis not present

## 2020-06-11 DIAGNOSIS — N3000 Acute cystitis without hematuria: Secondary | ICD-10-CM | POA: Diagnosis not present

## 2020-06-11 DIAGNOSIS — Z23 Encounter for immunization: Secondary | ICD-10-CM | POA: Diagnosis not present

## 2020-06-11 DIAGNOSIS — R42 Dizziness and giddiness: Secondary | ICD-10-CM | POA: Diagnosis not present

## 2020-06-21 ENCOUNTER — Other Ambulatory Visit: Payer: Self-pay

## 2020-06-21 ENCOUNTER — Ambulatory Visit
Admission: RE | Admit: 2020-06-21 | Discharge: 2020-06-21 | Disposition: A | Discharge status: Home / Self Care | Payer: Medicare PPO | Source: Ambulatory Visit | Attending: Family Medicine | Admitting: Family Medicine

## 2020-06-21 DIAGNOSIS — Z1231 Encounter for screening mammogram for malignant neoplasm of breast: Secondary | ICD-10-CM | POA: Diagnosis not present

## 2020-07-06 DIAGNOSIS — J385 Laryngeal spasm: Secondary | ICD-10-CM | POA: Diagnosis not present

## 2020-07-18 DIAGNOSIS — R0683 Snoring: Secondary | ICD-10-CM | POA: Diagnosis not present

## 2020-07-19 DIAGNOSIS — I10 Essential (primary) hypertension: Secondary | ICD-10-CM | POA: Diagnosis not present

## 2020-07-19 DIAGNOSIS — M5412 Radiculopathy, cervical region: Secondary | ICD-10-CM | POA: Diagnosis not present

## 2020-07-19 DIAGNOSIS — M48061 Spinal stenosis, lumbar region without neurogenic claudication: Secondary | ICD-10-CM | POA: Diagnosis not present

## 2020-07-19 DIAGNOSIS — Z6834 Body mass index (BMI) 34.0-34.9, adult: Secondary | ICD-10-CM | POA: Diagnosis not present

## 2020-08-25 DIAGNOSIS — N3001 Acute cystitis with hematuria: Secondary | ICD-10-CM | POA: Diagnosis not present

## 2020-09-13 DIAGNOSIS — N3020 Other chronic cystitis without hematuria: Secondary | ICD-10-CM | POA: Diagnosis not present

## 2020-09-13 DIAGNOSIS — N3281 Overactive bladder: Secondary | ICD-10-CM | POA: Diagnosis not present

## 2020-09-20 DIAGNOSIS — M47812 Spondylosis without myelopathy or radiculopathy, cervical region: Secondary | ICD-10-CM | POA: Diagnosis not present

## 2020-09-20 DIAGNOSIS — M5412 Radiculopathy, cervical region: Secondary | ICD-10-CM | POA: Diagnosis not present

## 2020-09-20 DIAGNOSIS — M48061 Spinal stenosis, lumbar region without neurogenic claudication: Secondary | ICD-10-CM | POA: Diagnosis not present

## 2020-10-22 DIAGNOSIS — M47812 Spondylosis without myelopathy or radiculopathy, cervical region: Secondary | ICD-10-CM | POA: Diagnosis not present

## 2020-10-23 DIAGNOSIS — M7741 Metatarsalgia, right foot: Secondary | ICD-10-CM | POA: Diagnosis not present

## 2020-10-23 DIAGNOSIS — M25571 Pain in right ankle and joints of right foot: Secondary | ICD-10-CM | POA: Diagnosis not present

## 2020-11-07 DIAGNOSIS — M47812 Spondylosis without myelopathy or radiculopathy, cervical region: Secondary | ICD-10-CM | POA: Diagnosis not present

## 2020-11-30 DIAGNOSIS — J385 Laryngeal spasm: Secondary | ICD-10-CM | POA: Diagnosis not present

## 2020-12-30 DIAGNOSIS — N3001 Acute cystitis with hematuria: Secondary | ICD-10-CM | POA: Diagnosis not present

## 2020-12-30 DIAGNOSIS — R3989 Other symptoms and signs involving the genitourinary system: Secondary | ICD-10-CM | POA: Diagnosis not present

## 2021-01-02 DIAGNOSIS — M47812 Spondylosis without myelopathy or radiculopathy, cervical region: Secondary | ICD-10-CM | POA: Diagnosis not present

## 2021-01-15 DIAGNOSIS — N3281 Overactive bladder: Secondary | ICD-10-CM | POA: Diagnosis not present

## 2021-01-15 DIAGNOSIS — N3000 Acute cystitis without hematuria: Secondary | ICD-10-CM | POA: Diagnosis not present

## 2021-01-29 DIAGNOSIS — M47812 Spondylosis without myelopathy or radiculopathy, cervical region: Secondary | ICD-10-CM | POA: Diagnosis not present

## 2021-02-04 DIAGNOSIS — N3020 Other chronic cystitis without hematuria: Secondary | ICD-10-CM | POA: Diagnosis not present

## 2021-02-04 DIAGNOSIS — N3000 Acute cystitis without hematuria: Secondary | ICD-10-CM | POA: Diagnosis not present

## 2021-02-27 DIAGNOSIS — Z0000 Encounter for general adult medical examination without abnormal findings: Secondary | ICD-10-CM | POA: Diagnosis not present

## 2021-02-27 DIAGNOSIS — E7800 Pure hypercholesterolemia, unspecified: Secondary | ICD-10-CM | POA: Diagnosis not present

## 2021-02-27 DIAGNOSIS — I10 Essential (primary) hypertension: Secondary | ICD-10-CM | POA: Diagnosis not present

## 2021-03-06 DIAGNOSIS — R42 Dizziness and giddiness: Secondary | ICD-10-CM | POA: Diagnosis not present

## 2021-03-06 DIAGNOSIS — I10 Essential (primary) hypertension: Secondary | ICD-10-CM | POA: Diagnosis not present

## 2021-03-06 DIAGNOSIS — Z0000 Encounter for general adult medical examination without abnormal findings: Secondary | ICD-10-CM | POA: Diagnosis not present

## 2021-03-06 DIAGNOSIS — N183 Chronic kidney disease, stage 3 unspecified: Secondary | ICD-10-CM | POA: Diagnosis not present

## 2021-03-06 DIAGNOSIS — E7800 Pure hypercholesterolemia, unspecified: Secondary | ICD-10-CM | POA: Diagnosis not present

## 2021-03-06 DIAGNOSIS — K219 Gastro-esophageal reflux disease without esophagitis: Secondary | ICD-10-CM | POA: Diagnosis not present

## 2021-03-06 DIAGNOSIS — F430 Acute stress reaction: Secondary | ICD-10-CM | POA: Diagnosis not present

## 2021-05-13 DIAGNOSIS — Z86010 Personal history of colonic polyps: Secondary | ICD-10-CM | POA: Diagnosis not present

## 2021-05-13 DIAGNOSIS — K648 Other hemorrhoids: Secondary | ICD-10-CM | POA: Diagnosis not present

## 2021-05-13 DIAGNOSIS — C184 Malignant neoplasm of transverse colon: Secondary | ICD-10-CM | POA: Diagnosis not present

## 2021-05-13 DIAGNOSIS — C189 Malignant neoplasm of colon, unspecified: Secondary | ICD-10-CM | POA: Diagnosis not present

## 2021-05-13 DIAGNOSIS — K5669 Other partial intestinal obstruction: Secondary | ICD-10-CM | POA: Diagnosis not present

## 2021-05-13 DIAGNOSIS — K5730 Diverticulosis of large intestine without perforation or abscess without bleeding: Secondary | ICD-10-CM | POA: Diagnosis not present

## 2021-05-13 DIAGNOSIS — D123 Benign neoplasm of transverse colon: Secondary | ICD-10-CM | POA: Diagnosis not present

## 2021-05-14 ENCOUNTER — Ambulatory Visit
Admission: RE | Admit: 2021-05-14 | Discharge: 2021-05-14 | Disposition: A | Discharge status: Home / Self Care | Payer: Medicare PPO | Source: Ambulatory Visit | Attending: Gastroenterology | Admitting: Gastroenterology

## 2021-05-14 ENCOUNTER — Other Ambulatory Visit: Payer: Self-pay

## 2021-05-14 ENCOUNTER — Other Ambulatory Visit: Payer: Self-pay | Admitting: Gastroenterology

## 2021-05-14 DIAGNOSIS — K449 Diaphragmatic hernia without obstruction or gangrene: Secondary | ICD-10-CM | POA: Diagnosis not present

## 2021-05-14 DIAGNOSIS — K6389 Other specified diseases of intestine: Secondary | ICD-10-CM

## 2021-05-14 DIAGNOSIS — K5730 Diverticulosis of large intestine without perforation or abscess without bleeding: Secondary | ICD-10-CM | POA: Diagnosis not present

## 2021-05-14 DIAGNOSIS — I2510 Atherosclerotic heart disease of native coronary artery without angina pectoris: Secondary | ICD-10-CM | POA: Diagnosis not present

## 2021-05-14 DIAGNOSIS — M419 Scoliosis, unspecified: Secondary | ICD-10-CM | POA: Diagnosis not present

## 2021-05-14 DIAGNOSIS — C799 Secondary malignant neoplasm of unspecified site: Secondary | ICD-10-CM | POA: Diagnosis not present

## 2021-05-14 MED ORDER — IOPAMIDOL (ISOVUE-370) INJECTION 76%
80.0000 mL | Freq: Once | INTRAVENOUS | Status: AC | PRN
  Administered 2021-05-14: 80 mL via INTRAVENOUS

## 2021-05-15 DIAGNOSIS — C184 Malignant neoplasm of transverse colon: Secondary | ICD-10-CM | POA: Diagnosis not present

## 2021-05-15 DIAGNOSIS — D123 Benign neoplasm of transverse colon: Secondary | ICD-10-CM | POA: Diagnosis not present

## 2021-05-16 DIAGNOSIS — C189 Malignant neoplasm of colon, unspecified: Secondary | ICD-10-CM | POA: Diagnosis not present

## 2021-05-20 ENCOUNTER — Ambulatory Visit: Payer: Self-pay | Admitting: General Surgery

## 2021-05-20 DIAGNOSIS — C184 Malignant neoplasm of transverse colon: Secondary | ICD-10-CM | POA: Diagnosis not present

## 2021-05-20 NOTE — H&P (View-Only) (Signed)
REFERRING PHYSICIAN:  Ronnette Juniper  PROVIDER:  Monico Blitz, MD  MRN: [id number] DOB: [birth year] DATE OF ENCOUNTER: 05/20/2021  Subjective   Chief Complaint: colon cancer    History of Present Illness: Tina Price is a 80 y.o. female who is seen today as an office consultation at the request of Dr. Therisa Doyne for evaluation of colon cancer.  80 year old female who presents to the office with a new diagnosis of a colon mass seen in her transverse colon on colonoscopy.  Biopsies show adenocarcinoma.  This was tattooed.  CT scans of the chest abdomen and pelvis show a circumferential apple core lesion in the proximal transverse colon near the hepatic flexure.  There is no definite evidence of metastatic disease in the chest abdomen pelvis although there are some pulmonary nodules that could not be characterized.    Review of Systems: A complete review of systems was obtained from the patient.  I have reviewed this information and discussed as appropriate with the patient.  See HPI as well for other ROS.     Medical History: Past Medical History:  Diagnosis Date   Arthritis    Hyperlipidemia    Hypertension     Patient Active Problem List  Diagnosis   Essential hypertension   Hyperlipidemia   Laryngeal spasm   Lumbosacral spondylosis with myelopathy   Migraine without aura and without status migrainosus, not intractable   Neuropathy   Recurrent cystitis   Spasmodic dysphonia    Past Surgical History:  Procedure Laterality Date   ARTHROSCOPIC ROTATOR CUFF REPAIR     SPINE SURGERY       Allergies  Allergen Reactions   Doxycycline Other (See Comments)    Thrush    Sulfa (Sulfonamide Antibiotics) Other (See Comments)    Mouth blisters unknown    Penicillins Other (See Comments) and Rash    unknown     Current Outpatient Medications on File Prior to Visit  Medication Sig Dispense Refill   buPROPion (WELLBUTRIN XL) 150 MG XL tablet      diazePAM  (VALIUM) 2 MG tablet Take by mouth at bedtime as needed     hydroCHLOROthiazide (MICROZIDE) 12.5 mg capsule      HYDROcodone-acetaminophen (NORCO) 5-325 mg tablet      MYRBETRIQ 50 mg ER tablet      sertraline (ZOLOFT) 50 MG tablet      simvastatin (ZOCOR) 20 MG tablet      verapamiL (CALAN-SR) 240 MG SR tablet      No current facility-administered medications on file prior to visit.    Family History  Problem Relation Age of Onset   High blood pressure (Hypertension) Mother    Colon cancer Mother    Skin cancer Sister      Social History   Tobacco Use  Smoking Status Former Smoker  Smokeless Tobacco Never Used     Social History   Socioeconomic History   Marital status: Widowed  Tobacco Use   Smoking status: Former Smoker   Smokeless tobacco: Never Used  Scientific laboratory technician Use: Never used  Substance and Sexual Activity   Alcohol use: Yes    Objective:    Vitals:   05/20/21 1438  BP: 124/72  Pulse: 84  Temp: 36.6 C (97.9 F)  SpO2: 97%  Weight: 74.1 kg (163 lb 6.4 oz)  Height: 152.4 cm (5')     Exam Gen: NAD CV: RRR Lungs:CTA Abd: soft, no scars noted    Labs, Imaging  and Diagnostic Testing: CT chest Abd and pelvis reviewed.  Mass noted in proximal transverse colon  Assessment and Plan:  Diagnoses and all orders for this visit:  Malignant neoplasm of transverse colon (CMS-HCC)    80 year old healthy female who presents to the office for evaluation of a new transverse colon cancer.  She presents for surgical evaluation.  I have recommended a robotic assisted partial colectomy.  We have discussed potential complications including anastomotic leak, wound infections, bleeding, hernia and potential chronic diarrhea due to loss of colon.  Patient has a history of chronic bladder infections and will discuss with her urologist about going on prophylactic antibiotics for UTI prior to surgery.  All questions were answered.  Patient would like to proceed  with surgery. The surgery and anatomy were described to the patient as well as the risks of surgery and the possible complications.  These include: Bleeding, deep abdominal infections and possible wound complications such as hernia and infection, damage to adjacent structures, leak of surgical connections, which can lead to other surgeries and possibly an ostomy, possible need for other procedures, such as abscess drains in radiology, possible prolonged hospital stay, possible diarrhea from removal of part of the colon, possible constipation from narcotics, possible bowel, bladder or sexual dysfunction if having rectal surgery, prolonged fatigue/weakness or appetite loss, possible early recurrence of of disease, possible complications of their medical problems such as heart disease or arrhythmias or lung problems, death (less than 1%). I believe the patient understands and wishes to proceed with the surgery.

## 2021-05-20 NOTE — H&P (Signed)
REFERRING PHYSICIAN:  Ronnette Juniper  PROVIDER:  Monico Blitz, MD  MRN: [id number] DOB: 10/25/1940 DATE OF ENCOUNTER: 05/20/2021  Subjective   Chief Complaint: colon cancer    History of Present Illness: Tina Price is a 80 y.o. female who is seen today as an office consultation at the request of Dr. Therisa Doyne for evaluation of colon cancer.  80 year old female who presents to the office with a new diagnosis of a colon mass seen in her transverse colon on colonoscopy.  Biopsies show adenocarcinoma.  This was tattooed.  CT scans of the chest abdomen and pelvis show a circumferential apple core lesion in the proximal transverse colon near the hepatic flexure.  There is no definite evidence of metastatic disease in the chest abdomen pelvis although there are some pulmonary nodules that could not be characterized.    Review of Systems: A complete review of systems was obtained from the patient.  I have reviewed this information and discussed as appropriate with the patient.  See HPI as well for other ROS.     Medical History: Past Medical History:  Diagnosis Date   Arthritis    Hyperlipidemia    Hypertension     Patient Active Problem List  Diagnosis   Essential hypertension   Hyperlipidemia   Laryngeal spasm   Lumbosacral spondylosis with myelopathy   Migraine without aura and without status migrainosus, not intractable   Neuropathy   Recurrent cystitis   Spasmodic dysphonia    Past Surgical History:  Procedure Laterality Date   ARTHROSCOPIC ROTATOR CUFF REPAIR     SPINE SURGERY       Allergies  Allergen Reactions   Doxycycline Other (See Comments)    Thrush    Sulfa (Sulfonamide Antibiotics) Other (See Comments)    Mouth blisters unknown    Penicillins Other (See Comments) and Rash    unknown     Current Outpatient Medications on File Prior to Visit  Medication Sig Dispense Refill   buPROPion (WELLBUTRIN XL) 150 MG XL tablet      diazePAM  (VALIUM) 2 MG tablet Take by mouth at bedtime as needed     hydroCHLOROthiazide (MICROZIDE) 12.5 mg capsule      HYDROcodone-acetaminophen (NORCO) 5-325 mg tablet      MYRBETRIQ 50 mg ER tablet      sertraline (ZOLOFT) 50 MG tablet      simvastatin (ZOCOR) 20 MG tablet      verapamiL (CALAN-SR) 240 MG SR tablet      No current facility-administered medications on file prior to visit.    Family History  Problem Relation Age of Onset   High blood pressure (Hypertension) Mother    Colon cancer Mother    Skin cancer Sister      Social History   Tobacco Use  Smoking Status Former Smoker  Smokeless Tobacco Never Used     Social History   Socioeconomic History   Marital status: Widowed  Tobacco Use   Smoking status: Former Smoker   Smokeless tobacco: Never Used  Scientific laboratory technician Use: Never used  Substance and Sexual Activity   Alcohol use: Yes    Objective:    Vitals:   05/20/21 1438  BP: 124/72  Pulse: 84  Temp: 36.6 C (97.9 F)  SpO2: 97%  Weight: 74.1 kg (163 lb 6.4 oz)  Height: 152.4 cm (5')     Exam Gen: NAD CV: RRR Lungs:CTA Abd: soft, no scars noted    Labs, Imaging  and Diagnostic Testing: CT chest Abd and pelvis reviewed.  Mass noted in proximal transverse colon  Assessment and Plan:  Diagnoses and all orders for this visit:  Malignant neoplasm of transverse colon (CMS-HCC)    80 year old healthy female who presents to the office for evaluation of a new transverse colon cancer.  She presents for surgical evaluation.  I have recommended a robotic assisted partial colectomy.  We have discussed potential complications including anastomotic leak, wound infections, bleeding, hernia and potential chronic diarrhea due to loss of colon.  Patient has a history of chronic bladder infections and will discuss with her urologist about going on prophylactic antibiotics for UTI prior to surgery.  All questions were answered.  Patient would like to proceed  with surgery. The surgery and anatomy were described to the patient as well as the risks of surgery and the possible complications.  These include: Bleeding, deep abdominal infections and possible wound complications such as hernia and infection, damage to adjacent structures, leak of surgical connections, which can lead to other surgeries and possibly an ostomy, possible need for other procedures, such as abscess drains in radiology, possible prolonged hospital stay, possible diarrhea from removal of part of the colon, possible constipation from narcotics, possible bowel, bladder or sexual dysfunction if having rectal surgery, prolonged fatigue/weakness or appetite loss, possible early recurrence of of disease, possible complications of their medical problems such as heart disease or arrhythmias or lung problems, death (less than 1%). I believe the patient understands and wishes to proceed with the surgery.

## 2021-05-22 NOTE — Progress Notes (Addendum)
COVID Vaccine Completed: yes x4 Date COVID Vaccine completed: 11/02/19, 11/23/19 Has received booster: 08/16/20, 03/01/21 COVID vaccine manufacturer: [place]      Date of COVID positive in last 90 days: No  PCP - Lawerance Cruel, MD Cardiologist - N/a  Chest x-ray - chest CT 05/14/21 Epic EKG - 05/23/21 Epic Stress Test - N/a ECHO - N/a Cardiac Cath - N/a Pacemaker/ICD device last checked: N/a Spinal Cord Stimulator: N/a  Sleep Study - N/a CPAP -   Fasting Blood Sugar - N/a Checks Blood Sugar _____ times a day  Blood Thinner Instructions: N/a Aspirin Instructions: Last Dose:  Activity level: Can go up a flight of stairs and perform activities of daily living without stopping and without symptoms of chest pain or shortness of breath. Takes stairs slow.    Anesthesia review: HTN, EKG anterior infarct.  Patient denies shortness of breath, fever, cough and chest pain at PAT appointment   Patient verbalized understanding of instructions that were given to them at the PAT appointment. Patient was also instructed that they will need to review over the PAT instructions again at home before surgery.

## 2021-05-22 NOTE — Patient Instructions (Addendum)
DUE TO COVID-19 ONLY ONE VISITOR IS ALLOWED TO COME WITH YOU AND STAY IN THE WAITING ROOM ONLY DURING PRE OP AND PROCEDURE.   **NO VISITORS ARE ALLOWED IN THE SHORT STAY AREA OR RECOVERY ROOM!!**  IF YOU WILL BE ADMITTED INTO THE HOSPITAL YOU ARE ALLOWED ONLY TWO SUPPORT PEOPLE DURING VISITATION HOURS ONLY (10AM -8PM)   The support person(s) may change daily. The support person(s) must pass our screening, gel in and out, and wear a mask at all times, including in the patient's room. Patients must also wear a mask when staff or their support person are in the room.  No visitors under the age of 17. Any visitor under the age of 6 must be accompanied by an adult.    COVID SWAB TESTING MUST BE COMPLETED ON:  06/05/21 **MUST PRESENT COMPLETED FORM AT TESTING SITE**    [facility] [place] (backside of the building) Open 8am-3pm. No appointment needed. You are not required to quarantine, however you are required to wear a well-fitted mask when you are out and around people not in your household.  Hand Hygiene often Do NOT share personal items Notify your provider if you are in close contact with someone who has COVID or you develop fever 100.4 or greater, new onset of sneezing, cough, sore throat, shortness of breath or body aches.       Your procedure is scheduled on: 06/07/21   Report to [facility] Main  Entrance   Report to Short Stay at 5:15 AM   [facility])   Call this number if you have problems the morning of surgery [phone]   MORNING OF SURGERY DRINK:   DRINK 1 G2 drink BEFORE YOU LEAVE HOME, DRINK ALL OF THE  G2 DRINK AT ONE TIME.   NO SOLID FOOD AFTER 600 PM THE NIGHT BEFORE YOUR SURGERY. YOU MAY DRINK CLEAR FLUIDS. THE G2 DRINK YOU DRINK BEFORE YOU LEAVE HOME WILL BE THE LAST FLUIDS YOU DRINK BEFORE SURGERY.  PAIN IS EXPECTED AFTER SURGERY AND WILL NOT BE COMPLETELY ELIMINATED. AMBULATION AND TYLENOL WILL HELP REDUCE INCISIONAL AND GAS PAIN. MOVEMENT  IS KEY!  YOU ARE EXPECTED TO BE OUT OF BED WITHIN 4 HOURS OF ADMISSION TO YOUR PATIENT ROOM.  SITTING IN THE RECLINER THROUGHOUT THE DAY IS IMPORTANT FOR DRINKING FLUIDS AND MOVING GAS THROUGHOUT THE GI TRACT.  COMPRESSION STOCKINGS SHOULD BE WORN [place] UNLESS YOU ARE WALKING.   INCENTIVE SPIROMETER SHOULD BE USED EVERY HOUR WHILE AWAKE TO DECREASE POST-OPERATIVE COMPLICATIONS SUCH AS PNEUMONIA.  WHEN DISCHARGED HOME, IT IS IMPORTANT TO CONTINUE TO WALK EVERY HOUR AND USE THE INCENTIVE SPIROMETER EVERY HOUR.    No solid food after 6pm day before suegery.      May have liquids until   4:30 AM day of surgery  CLEAR LIQUID DIET  Foods Allowed                                                                     Foods Excluded  Water, Black Coffee and tea, regular and decaf               liquids that you cannot  Plain Jell-O in any flavor  (No  red)                                     see through such as: Fruit ices (not with fruit pulp)                                             milk, soups, orange juice              Iced Popsicles (No red)                                                All solid food                                   Apple juices Sports drinks like Gatorade (No red) Lightly seasoned clear broth or consume(fat free) Sugar, honey syrup     Complete one Ensure drink the morning of surgery at  4:30 AM  the day of surgery.   Drink 2 Ensure drinks the night before surgery. Have them complete by 10pm     The day of surgery:  Drink ONE (1) Pre-Surgery Clear Ensure the morning of surgery. Drink in one sitting. Do not sip.  This drink was given to you during your hospital  pre-op appointment visit. Nothing else to drink after completing the  Pre-Surgery Clear Ensure          If you have questions, please contact your surgeon's office.     Oral Hygiene is also important to reduce your risk of infection.                                     Remember - BRUSH YOUR TEETH THE MORNING OF SURGERY WITH YOUR REGULAR TOOTHPASTE   Take these medicines the morning of surgery with A SIP OF WATER: Wellbutrin, Methenamine, Zofran, Zoloft.                               You may not have any metal on your body including hair pins, jewelry, and body piercing             Do not wear make-up, lotions, powders, perfumes, or deodorant  Do not wear nail polish including gel and S&S, artificial/acrylic nails, or any other type of covering on natural nails including finger and toenails. If you have artificial nails, gel coating, etc. that needs to be removed by a nail salon please have this removed prior to surgery or surgery may need to be canceled/ delayed if the surgeon/ anesthesia feels like they are unable to be safely monitored.   Do not shave  48 hours prior to surgery.    Do not bring valuables to the hospital. [place].   Contacts, dentures or bridgework may not be worn into surgery.   Bring small overnight bag day of  surgery.   Special Instructions: Bring a copy of your healthcare power of attorney and living will documents         the day of surgery if you haven't scanned them in before.  Please read over the following fact sheets you were given: IF YOU HAVE QUESTIONS ABOUT YOUR PRE OP INSTRUCTIONS PLEASE CALL [phone]- [facility] - Preparing for Surgery Before surgery, you can play an important role.  Because skin is not sterile, your skin needs to be as free of germs as possible.  You can reduce the number of germs on your skin by washing with CHG (chlorahexidine gluconate) soap before surgery.  CHG is an antiseptic cleaner which kills germs and bonds with the skin to continue killing germs even after washing. Please DO NOT use if you have an allergy to CHG or antibacterial soaps.  If your skin becomes reddened/irritated stop using the CHG and inform your nurse when you  arrive at Short Stay. Do not shave (including legs and underarms) for at least 48 hours prior to the first CHG shower.  You may shave your face/neck.  Please follow these instructions carefully:  1.  Shower with CHG Soap the night before surgery and the  morning of surgery.  2.  If you choose to wash your hair, wash your hair first as usual with your normal  shampoo.  3.  After you shampoo, rinse your hair and body thoroughly to remove the shampoo.                             4.  Use CHG as you would any other liquid soap.  You can apply chg directly to the skin and wash.  Gently with a scrungie or clean washcloth.  5.  Apply the CHG Soap to your body ONLY FROM THE NECK DOWN.   Do   not use on face/ open                           Wound or open sores. Avoid contact with eyes, ears mouth and   genitals (private parts).                       Wash face,  Genitals (private parts) with your normal soap.             6.  Wash thoroughly, paying special attention to the area where your    surgery  will be performed.  7.  Thoroughly rinse your body with warm water from the neck down.  8.  DO NOT shower/wash with your normal soap after using and rinsing off the CHG Soap.                9.  Pat yourself dry with a clean towel.            10.  Wear clean pajamas.            11.  Place clean sheets on your bed the night of your first shower and do not  sleep with pets. Day of Surgery : Do not apply any lotions/deodorants the morning of surgery.  Please wear clean clothes to the hospital/surgery center.  FAILURE TO FOLLOW THESE INSTRUCTIONS MAY RESULT IN THE CANCELLATION OF YOUR SURGERY  PATIENT SIGNATURE_________________________________  NURSE SIGNATURE__________________________________  ________________________________________________________________________   Adam Phenix  An incentive spirometer is  a tool that can help keep your lungs clear and active. This tool measures how well you are  filling your lungs with each breath. Taking long deep breaths may help reverse or decrease the chance of developing breathing (pulmonary) problems (especially infection) following: A long period of time when you are unable to move or be active. BEFORE THE PROCEDURE  If the spirometer includes an indicator to show your best effort, your nurse or respiratory therapist will set it to a desired goal. If possible, sit up straight or lean slightly forward. Try not to slouch. Hold the incentive spirometer in an upright position. INSTRUCTIONS FOR USE  Sit on the edge of your bed if possible, or sit up as far as you can in bed or on a chair. Hold the incentive spirometer in an upright position. Breathe out normally. Place the mouthpiece in your mouth and seal your lips tightly around it. Breathe in slowly and as deeply as possible, raising the piston or the ball toward the top of the column. Hold your breath for 3-5 seconds or for as long as possible. Allow the piston or ball to fall to the bottom of the column. Remove the mouthpiece from your mouth and breathe out normally. Rest for a few seconds and repeat Steps 1 through 7 at least 10 times every 1-2 hours when you are awake. Take your time and take a few normal breaths between deep breaths. The spirometer may include an indicator to show your best effort. Use the indicator as a goal to work toward during each repetition. After each set of 10 deep breaths, practice coughing to be sure your lungs are clear. If you have an incision (the cut made at the time of surgery), support your incision when coughing by placing a pillow or rolled up towels firmly against it. Once you are able to get out of bed, walk around indoors and cough well. You may stop using the incentive spirometer when instructed by your caregiver.  RISKS AND COMPLICATIONS Take your time so you do not get dizzy or light-headed. If you are in pain, you may need to take or ask for pain  medication before doing incentive spirometry. It is harder to take a deep breath if you are having pain. AFTER USE Rest and breathe slowly and easily. It can be helpful to keep track of a log of your progress. Your caregiver can provide you with a simple table to help with this. If you are using the spirometer at home, follow these instructions: [place] IF:  You are having difficultly using the spirometer. You have trouble using the spirometer as often as instructed. Your pain medication is not giving enough relief while using the spirometer. You develop fever of 100.5 F (38.1 C) or higher. SEEK IMMEDIATE MEDICAL CARE IF:  You cough up bloody sputum that had not been present before. You develop fever of 102 F (38.9 C) or greater. You develop worsening pain at or near the incision site. MAKE SURE YOU:  Understand these instructions. Will watch your condition. Will get help right away if you are not doing well or get worse. Document Released: 02/09/2007 Document Revised: 12/22/2011 Document Reviewed: 04/12/2007 [facility] Patient Information 2014 [place], [place].   ________________________________________________________________________

## 2021-05-23 ENCOUNTER — Encounter (HOSPITAL_COMMUNITY): Payer: Self-pay

## 2021-05-23 ENCOUNTER — Encounter (HOSPITAL_COMMUNITY)
Admission: RE | Admit: 2021-05-23 | Discharge: 2021-05-23 | Disposition: A | Discharge status: Home / Self Care | Payer: Medicare PPO | Source: Ambulatory Visit | Attending: General Surgery | Admitting: General Surgery

## 2021-05-23 ENCOUNTER — Other Ambulatory Visit: Payer: Self-pay

## 2021-05-23 DIAGNOSIS — Z01818 Encounter for other preprocedural examination: Secondary | ICD-10-CM | POA: Insufficient documentation

## 2021-05-23 HISTORY — DX: Headache, unspecified: R51.9

## 2021-05-23 HISTORY — DX: Malignant (primary) neoplasm, unspecified: C80.1

## 2021-05-23 HISTORY — DX: Essential (primary) hypertension: I10

## 2021-05-23 HISTORY — DX: Urinary tract infection, site not specified: N39.0

## 2021-05-23 LAB — BASIC METABOLIC PANEL
Anion gap: 11 (ref 5–15)
BUN: 23 mg/dL (ref 8–23)
CO2: 26 mmol/L (ref 22–32)
Calcium: 10.2 mg/dL (ref 8.9–10.3)
Chloride: 104 mmol/L (ref 98–111)
Creatinine, Ser: 1.17 mg/dL — ABNORMAL HIGH (ref 0.44–1.00)
GFR, Estimated: 47 mL/min — ABNORMAL LOW (ref >60)
Glucose, Bld: 101 mg/dL — ABNORMAL HIGH (ref 70–99)
Potassium: 4.4 mmol/L (ref 3.5–5.1)
Sodium: 141 mmol/L (ref 135–145)

## 2021-05-23 LAB — CBC
HCT: 37.7 % (ref 36.0–46.0)
Hemoglobin: 12.1 g/dL (ref 12.0–15.0)
MCH: 30.9 pg (ref 26.0–34.0)
MCHC: 32.1 g/dL (ref 30.0–36.0)
MCV: 96.2 fL (ref 80.0–100.0)
Platelets: 249 10*3/uL (ref 150–400)
RBC: 3.92 MIL/uL (ref 3.87–5.11)
RDW: 14 % (ref 11.5–15.5)
WBC: 7.8 10*3/uL (ref 4.0–10.5)
nRBC: 0 % (ref 0.0–0.2)

## 2021-05-23 LAB — HEMOGLOBIN A1C
Hgb A1c MFr Bld: 5.3 % (ref 4.8–5.6)
Mean Plasma Glucose: 105.41 mg/dL

## 2021-05-24 LAB — CEA: CEA: 2.1 ng/mL (ref 0.0–4.7)

## 2021-05-27 NOTE — Progress Notes (Addendum)
Anesthesia Chart Review:   Case: [id number] Date/Time: 06/07/21 0715   Procedure: XI ROBOT ASSISTED LAPAROSCOPIC PARTIAL COLECTOMY   Anesthesia type: General   Pre-op diagnosis: proximal transverse colon cancer   Location: [facility] ROOM 02 / [facility]   Surgeons: Leighton Ruff, MD       DISCUSSION: Pt is 80 years old with hx HTN  Pt reported at presurgical testing she can go up a flight of stairs and perform activities of daily living without stopping and without symptoms of chest pain or shortness of breath.   VS: BP 136/77   Pulse 81   Temp 36.5 C (Oral)   Resp 18   Ht 5' (1.524 m)   SpO2 99%   BMI 34.76 kg/m   PROVIDERS: - PCP is Lawerance Cruel, MD   LABS: Labs reviewed: Acceptable for surgery. (all labs ordered are listed, but only abnormal results are displayed)  Labs Reviewed  BASIC METABOLIC PANEL - Abnormal; Notable for the following components:      Result Value   Glucose, Bld 101 (*)    Creatinine, Ser 1.17 (*)    GFR, Estimated 47 (*)    All other components within normal limits  HEMOGLOBIN A1C  CEA  CBC     IMAGES: CT chest abd pelvis 05/14/21:  1. There is circumferential, apple-core like wall thickening of the proximal transverse colon, near the hepatic flexure, involving a segment approximately 2.5 cm in length, total thickness 2.3 cm. Findings are consistent with primary colon malignancy identified by colonoscopy. 2. No definite evidence of metastatic disease in the chest, abdomen, or pelvis. No lymphadenopathy. 3. There are numerous small pulmonary nodules scattered throughout the lungs, many clustered dependently in the bilateral lung bases, measuring 4 mm and smaller. Appearance and distribution strongly favor benign, incidental sequelae of prior infection or inflammation, particularly given the absence of other evidence of metastatic disease. Metastatic disease is however not strictly excluded. Attention on follow-up. 4. Coronary artery  disease.   EKG 05/23/21: NSR. Cannot rule out Anterior infarct , age undetermined. Nonspecific T wave abnormality - No prior EKG available for comparison  CV: N/A  Past Medical History:  Diagnosis Date   Arthritis    Back pain, chronic    Cancer (HCC)    skin   Chronic UTI    Headache    Hypertension     Past Surgical History:  Procedure Laterality Date   DILATION AND CURETTAGE OF UTERUS     LUMBAR FUSION     SHOULDER ARTHROSCOPY WITH OPEN ROTATOR CUFF REPAIR      MEDICATIONS:  AZO-CRANBERRY PO   buPROPion (WELLBUTRIN XL) 150 MG 24 hr tablet   diazepam (VALIUM) 5 MG tablet   estradiol (ESTRACE) 0.1 MG/GM vaginal cream   hydrochlorothiazide (MICROZIDE) 12.5 MG capsule   HYDROcodone-acetaminophen (NORCO/VICODIN) 5-325 MG tablet   methenamine (HIPREX) 1 g tablet   metroNIDAZOLE (FLAGYL) 500 MG tablet   Multiple Vitamins-Minerals (MULTIVITAMIN WITH MINERALS) tablet   MYRBETRIQ 50 MG TB24 tablet   ondansetron (ZOFRAN-ODT) 8 MG disintegrating tablet   sertraline (ZOLOFT) 50 MG tablet   simvastatin (ZOCOR) 20 MG tablet   topiramate (TOPAMAX) 25 MG tablet   verapamil (CALAN-SR) 240 MG CR tablet   No current facility-administered medications for this encounter.    If no changes, I anticipate pt can proceed with surgery as scheduled.   Willeen Cass, PhD, FNP-BC [facility]/Anesthesiology Phone: [phone] 05/27/2021 3:09 PM

## 2021-05-27 NOTE — Anesthesia Preprocedure Evaluation (Addendum)
Anesthesia Evaluation  Patient identified by MRN, date of birth, ID band Patient awake    Reviewed: Allergy & Precautions, NPO status , Patient's Chart, lab work & pertinent test results  Airway Mallampati: II  TM Distance: >3 FB Neck ROM: Full    Dental no notable dental hx.    Pulmonary former smoker,    Pulmonary exam normal breath sounds clear to auscultation       Cardiovascular METS: 3 - Mets hypertension, Pt. on medications Normal cardiovascular exam Rhythm:Regular Rate:Normal  EKG reviewed   Neuro/Psych  Headaches, Spasmodic dysphonia affecting voice quality negative psych ROS   GI/Hepatic Neg liver ROS, Colon cancer + nausea   Endo/Other  hyperlipidemia  Renal/GU negative Renal ROS  negative genitourinary   Musculoskeletal  (+) Arthritis , Osteoarthritis,    Abdominal   Peds  Hematology negative hematology ROS (+)   Anesthesia Other Findings   Reproductive/Obstetrics negative OB ROS                           Anesthesia Physical Anesthesia Plan  ASA: 3  Anesthesia Plan: General   Post-op Pain Management:    Induction: Intravenous and Rapid sequence  PONV Risk Score and Plan: 3 and Treatment may vary due to age or medical condition, Ondansetron and Dexamethasone  Airway Management Planned: Oral ETT  Additional Equipment: None  Intra-op Plan:   Post-operative Plan: Extubation in OR  Informed Consent: I have reviewed the patients History and Physical, chart, labs and discussed the procedure including the risks, benefits and alternatives for the proposed anesthesia with the patient or authorized representative who has indicated his/her understanding and acceptance.     Dental advisory given  Plan Discussed with: Anesthesiologist and CRNA  Anesthesia Plan Comments: (GETA. RSI given nausea. No vomiting. Norton Blizzard, MD  )      Anesthesia Quick Evaluation

## 2021-05-31 DIAGNOSIS — Z961 Presence of intraocular lens: Secondary | ICD-10-CM | POA: Diagnosis not present

## 2021-05-31 DIAGNOSIS — H52203 Unspecified astigmatism, bilateral: Secondary | ICD-10-CM | POA: Diagnosis not present

## 2021-06-05 ENCOUNTER — Other Ambulatory Visit: Payer: Self-pay | Admitting: General Surgery

## 2021-06-06 ENCOUNTER — Encounter (HOSPITAL_COMMUNITY): Payer: Self-pay | Admitting: General Surgery

## 2021-06-06 LAB — SARS CORONAVIRUS 2 (TAT 6-24 HRS): SARS Coronavirus 2: NEGATIVE

## 2021-06-06 MED ORDER — BUPIVACAINE LIPOSOME 1.3 % IJ SUSP
20.0000 mL | Freq: Once | INTRAMUSCULAR | Status: DC
  Filled 2021-06-06: qty 20

## 2021-06-07 ENCOUNTER — Inpatient Hospital Stay (HOSPITAL_COMMUNITY): Payer: Medicare PPO | Admitting: Certified Registered Nurse Anesthetist

## 2021-06-07 ENCOUNTER — Other Ambulatory Visit: Payer: Self-pay

## 2021-06-07 ENCOUNTER — Inpatient Hospital Stay (HOSPITAL_COMMUNITY)
Admission: RE | Admit: 2021-06-07 | Discharge: 2021-06-10 | DRG: 331 | Disposition: A | Discharge status: Home / Self Care | Payer: Medicare PPO | Attending: General Surgery | Admitting: General Surgery

## 2021-06-07 ENCOUNTER — Encounter (HOSPITAL_COMMUNITY)
Admission: RE | Disposition: A | Discharge status: Home / Self Care | Payer: Self-pay | Source: Home / Self Care | Attending: General Surgery

## 2021-06-07 ENCOUNTER — Inpatient Hospital Stay (HOSPITAL_COMMUNITY): Payer: Medicare PPO | Admitting: Emergency Medicine

## 2021-06-07 ENCOUNTER — Encounter (HOSPITAL_COMMUNITY): Payer: Self-pay | Admitting: General Surgery

## 2021-06-07 DIAGNOSIS — E785 Hyperlipidemia, unspecified: Secondary | ICD-10-CM | POA: Diagnosis present

## 2021-06-07 DIAGNOSIS — R918 Other nonspecific abnormal finding of lung field: Secondary | ICD-10-CM | POA: Diagnosis not present

## 2021-06-07 DIAGNOSIS — G43009 Migraine without aura, not intractable, without status migrainosus: Secondary | ICD-10-CM | POA: Diagnosis present

## 2021-06-07 DIAGNOSIS — Z79899 Other long term (current) drug therapy: Secondary | ICD-10-CM | POA: Diagnosis not present

## 2021-06-07 DIAGNOSIS — Z882 Allergy status to sulfonamides status: Secondary | ICD-10-CM

## 2021-06-07 DIAGNOSIS — Z8249 Family history of ischemic heart disease and other diseases of the circulatory system: Secondary | ICD-10-CM

## 2021-06-07 DIAGNOSIS — M4716 Other spondylosis with myelopathy, lumbar region: Secondary | ICD-10-CM | POA: Diagnosis not present

## 2021-06-07 DIAGNOSIS — Z87440 Personal history of urinary (tract) infections: Secondary | ICD-10-CM

## 2021-06-07 DIAGNOSIS — C189 Malignant neoplasm of colon, unspecified: Secondary | ICD-10-CM | POA: Diagnosis present

## 2021-06-07 DIAGNOSIS — Z881 Allergy status to other antibiotic agents status: Secondary | ICD-10-CM

## 2021-06-07 DIAGNOSIS — C184 Malignant neoplasm of transverse colon: Principal | ICD-10-CM | POA: Diagnosis present

## 2021-06-07 DIAGNOSIS — K660 Peritoneal adhesions (postprocedural) (postinfection): Secondary | ICD-10-CM | POA: Diagnosis present

## 2021-06-07 DIAGNOSIS — I10 Essential (primary) hypertension: Secondary | ICD-10-CM | POA: Diagnosis present

## 2021-06-07 DIAGNOSIS — Z800 Family history of malignant neoplasm of digestive organs: Secondary | ICD-10-CM | POA: Diagnosis not present

## 2021-06-07 DIAGNOSIS — Z87891 Personal history of nicotine dependence: Secondary | ICD-10-CM | POA: Diagnosis not present

## 2021-06-07 DIAGNOSIS — Z880 Allergy status to penicillin: Secondary | ICD-10-CM | POA: Diagnosis not present

## 2021-06-07 DIAGNOSIS — R238 Other skin changes: Secondary | ICD-10-CM | POA: Diagnosis not present

## 2021-06-07 LAB — TYPE AND SCREEN
ABO/RH(D): O NEG
Antibody Screen: NEGATIVE

## 2021-06-07 LAB — ABO/RH: ABO/RH(D): O NEG

## 2021-06-07 SURGERY — COLECTOMY, PARTIAL, ROBOT-ASSISTED, LAPAROSCOPIC
Anesthesia: General | Site: Abdomen

## 2021-06-07 MED ORDER — ACETAMINOPHEN 500 MG PO TABS
1000.0000 mg | ORAL_TABLET | Freq: Four times a day (QID) | ORAL | Status: DC
  Administered 2021-06-07 – 2021-06-10 (×11): 1000 mg via ORAL
  Filled 2021-06-07 (×11): qty 2

## 2021-06-07 MED ORDER — KCL IN DEXTROSE-NACL 20-5-0.45 MEQ/L-%-% IV SOLN
INTRAVENOUS | Status: DC
  Filled 2021-06-07 (×3): qty 1000

## 2021-06-07 MED ORDER — DEXAMETHASONE SODIUM PHOSPHATE 4 MG/ML IJ SOLN
INTRAMUSCULAR | Status: DC | PRN
  Administered 2021-06-07: 5 mg via INTRAVENOUS

## 2021-06-07 MED ORDER — ROCURONIUM BROMIDE 10 MG/ML (PF) SYRINGE
PREFILLED_SYRINGE | INTRAVENOUS | Status: AC
  Filled 2021-06-07: qty 10

## 2021-06-07 MED ORDER — FENTANYL CITRATE (PF) 100 MCG/2ML IJ SOLN
INTRAMUSCULAR | Status: DC | PRN
  Administered 2021-06-07 (×2): 50 ug via INTRAVENOUS
  Administered 2021-06-07: 25 ug via INTRAVENOUS
  Administered 2021-06-07: 50 ug via INTRAVENOUS
  Administered 2021-06-07: 25 ug via INTRAVENOUS

## 2021-06-07 MED ORDER — SUGAMMADEX SODIUM 200 MG/2ML IV SOLN
INTRAVENOUS | Status: DC | PRN
  Administered 2021-06-07: 200 mg via INTRAVENOUS

## 2021-06-07 MED ORDER — LIDOCAINE HCL (PF) 2 % IJ SOLN
INTRAMUSCULAR | Status: DC | PRN
Start: 2021-06-07 — End: 2021-06-07
  Administered 2021-06-07: 1 mg/kg/h

## 2021-06-07 MED ORDER — SODIUM CHLORIDE 0.9 % IV SOLN
2.0000 g | Freq: Two times a day (BID) | INTRAVENOUS | Status: AC
  Administered 2021-06-07: 2 g via INTRAVENOUS
  Filled 2021-06-07: qty 2

## 2021-06-07 MED ORDER — ALVIMOPAN 12 MG PO CAPS
12.0000 mg | ORAL_CAPSULE | Freq: Two times a day (BID) | ORAL | Status: DC
  Administered 2021-06-08: 12 mg via ORAL
  Filled 2021-06-07 (×2): qty 1

## 2021-06-07 MED ORDER — LACTATED RINGERS IR SOLN
Status: DC | PRN
  Administered 2021-06-07: 1000 mL

## 2021-06-07 MED ORDER — PROPOFOL 500 MG/50ML IV EMUL
INTRAVENOUS | Status: DC | PRN
  Administered 2021-06-07: 25 ug/kg/min via INTRAVENOUS

## 2021-06-07 MED ORDER — LIDOCAINE 2% (20 MG/ML) 5 ML SYRINGE
INTRAMUSCULAR | Status: AC
  Filled 2021-06-07: qty 5

## 2021-06-07 MED ORDER — LABETALOL HCL 5 MG/ML IV SOLN
INTRAVENOUS | Status: DC | PRN
  Administered 2021-06-07 (×2): 5 mg via INTRAVENOUS

## 2021-06-07 MED ORDER — LACTATED RINGERS IV SOLN: INTRAVENOUS | Status: DC

## 2021-06-07 MED ORDER — ROCURONIUM BROMIDE 10 MG/ML (PF) SYRINGE
PREFILLED_SYRINGE | INTRAVENOUS | Status: DC | PRN
  Administered 2021-06-07: 70 mg via INTRAVENOUS

## 2021-06-07 MED ORDER — ENSURE PRE-SURGERY PO LIQD
592.0000 mL | Freq: Once | ORAL | Status: DC
  Filled 2021-06-07: qty 592

## 2021-06-07 MED ORDER — ONDANSETRON HCL 4 MG PO TABS: 4.0000 mg | ORAL_TABLET | Freq: Four times a day (QID) | ORAL | Status: DC | PRN

## 2021-06-07 MED ORDER — TRAMADOL HCL 50 MG PO TABS
50.0000 mg | ORAL_TABLET | Freq: Four times a day (QID) | ORAL | Status: DC | PRN
  Administered 2021-06-07 – 2021-06-09 (×2): 50 mg via ORAL
  Filled 2021-06-07 (×2): qty 1

## 2021-06-07 MED ORDER — ONDANSETRON HCL 4 MG/2ML IJ SOLN: 4.0000 mg | Freq: Four times a day (QID) | INTRAMUSCULAR | Status: DC | PRN

## 2021-06-07 MED ORDER — ONDANSETRON HCL 4 MG/2ML IJ SOLN: 4.0000 mg | Freq: Once | INTRAMUSCULAR | Status: DC | PRN

## 2021-06-07 MED ORDER — HYDROMORPHONE HCL 1 MG/ML IJ SOLN: 0.5000 mg | INTRAMUSCULAR | Status: DC | PRN

## 2021-06-07 MED ORDER — SIMETHICONE 80 MG PO CHEW: 40.0000 mg | CHEWABLE_TABLET | Freq: Four times a day (QID) | ORAL | Status: DC | PRN

## 2021-06-07 MED ORDER — MIRABEGRON ER 25 MG PO TB24
50.0000 mg | ORAL_TABLET | Freq: Every day | ORAL | Status: DC
  Administered 2021-06-07 – 2021-06-10 (×4): 50 mg via ORAL
  Filled 2021-06-07 (×4): qty 2

## 2021-06-07 MED ORDER — ALVIMOPAN 12 MG PO CAPS
12.0000 mg | ORAL_CAPSULE | ORAL | Status: AC
  Administered 2021-06-07: 12 mg via ORAL
  Filled 2021-06-07: qty 1

## 2021-06-07 MED ORDER — FENTANYL CITRATE PF 50 MCG/ML IJ SOSY
25.0000 ug | PREFILLED_SYRINGE | INTRAMUSCULAR | Status: DC | PRN
  Administered 2021-06-07 (×3): 50 ug via INTRAVENOUS

## 2021-06-07 MED ORDER — FENTANYL CITRATE (PF) 100 MCG/2ML IJ SOLN
INTRAMUSCULAR | Status: AC
  Filled 2021-06-07: qty 2

## 2021-06-07 MED ORDER — SUCCINYLCHOLINE CHLORIDE 200 MG/10ML IV SOSY
PREFILLED_SYRINGE | INTRAVENOUS | Status: AC
  Filled 2021-06-07: qty 10

## 2021-06-07 MED ORDER — ONDANSETRON HCL 4 MG/2ML IJ SOLN
INTRAMUSCULAR | Status: DC | PRN
  Administered 2021-06-07 (×2): 4 mg via INTRAVENOUS

## 2021-06-07 MED ORDER — PHENYLEPHRINE HCL-NACL 20-0.9 MG/250ML-% IV SOLN
INTRAVENOUS | Status: DC | PRN
  Administered 2021-06-07: 15 ug/min via INTRAVENOUS

## 2021-06-07 MED ORDER — ENSURE SURGERY PO LIQD: 237.0000 mL | Freq: Two times a day (BID) | ORAL | Status: DC

## 2021-06-07 MED ORDER — OXYCODONE HCL 5 MG/5ML PO SOLN
5.0000 mg | Freq: Once | ORAL | Status: DC | PRN
Start: 2021-06-07 — End: 2021-06-07

## 2021-06-07 MED ORDER — LABETALOL HCL 5 MG/ML IV SOLN
INTRAVENOUS | Status: AC
  Filled 2021-06-07: qty 4

## 2021-06-07 MED ORDER — SACCHAROMYCES BOULARDII 250 MG PO CAPS
250.0000 mg | ORAL_CAPSULE | Freq: Two times a day (BID) | ORAL | Status: DC
  Administered 2021-06-07 – 2021-06-10 (×6): 250 mg via ORAL
  Filled 2021-06-07 (×6): qty 1

## 2021-06-07 MED ORDER — SUCCINYLCHOLINE CHLORIDE 200 MG/10ML IV SOSY
PREFILLED_SYRINGE | INTRAVENOUS | Status: DC | PRN
  Administered 2021-06-07: 140 mg via INTRAVENOUS

## 2021-06-07 MED ORDER — PHENYLEPHRINE HCL-NACL 20-0.9 MG/250ML-% IV SOLN
INTRAVENOUS | Status: AC
  Filled 2021-06-07: qty 500

## 2021-06-07 MED ORDER — 0.9 % SODIUM CHLORIDE (POUR BTL) OPTIME
TOPICAL | Status: DC | PRN
  Administered 2021-06-07: 2000 mL

## 2021-06-07 MED ORDER — ACETAMINOPHEN 500 MG PO TABS
1000.0000 mg | ORAL_TABLET | ORAL | Status: AC
  Administered 2021-06-07: 1000 mg via ORAL
  Filled 2021-06-07: qty 2

## 2021-06-07 MED ORDER — HYDROCHLOROTHIAZIDE 12.5 MG PO CAPS
12.5000 mg | ORAL_CAPSULE | Freq: Every day | ORAL | Status: DC
  Administered 2021-06-07 – 2021-06-10 (×4): 12.5 mg via ORAL
  Filled 2021-06-07 (×4): qty 1

## 2021-06-07 MED ORDER — GABAPENTIN 300 MG PO CAPS
300.0000 mg | ORAL_CAPSULE | Freq: Two times a day (BID) | ORAL | Status: DC
  Administered 2021-06-07 – 2021-06-10 (×7): 300 mg via ORAL
  Filled 2021-06-07 (×7): qty 1

## 2021-06-07 MED ORDER — FENTANYL CITRATE PF 50 MCG/ML IJ SOSY
PREFILLED_SYRINGE | INTRAMUSCULAR | Status: AC
  Filled 2021-06-07: qty 2

## 2021-06-07 MED ORDER — NITROFURANTOIN MACROCRYSTAL 100 MG PO CAPS
100.0000 mg | ORAL_CAPSULE | Freq: Every day | ORAL | Status: DC
  Administered 2021-06-07 – 2021-06-09 (×3): 100 mg via ORAL
  Filled 2021-06-07 (×3): qty 1

## 2021-06-07 MED ORDER — ENOXAPARIN SODIUM 40 MG/0.4ML IJ SOSY
40.0000 mg | PREFILLED_SYRINGE | INTRAMUSCULAR | Status: DC
  Administered 2021-06-08 – 2021-06-10 (×3): 40 mg via SUBCUTANEOUS
  Filled 2021-06-07 (×3): qty 0.4

## 2021-06-07 MED ORDER — SERTRALINE HCL 50 MG PO TABS
50.0000 mg | ORAL_TABLET | Freq: Every day | ORAL | Status: DC
  Administered 2021-06-08 – 2021-06-10 (×3): 50 mg via ORAL
  Filled 2021-06-07 (×3): qty 1

## 2021-06-07 MED ORDER — LIDOCAINE 2% (20 MG/ML) 5 ML SYRINGE
INTRAMUSCULAR | Status: AC
  Filled 2021-06-07: qty 10

## 2021-06-07 MED ORDER — ENSURE PRE-SURGERY PO LIQD
296.0000 mL | Freq: Once | ORAL | Status: DC
  Filled 2021-06-07: qty 296

## 2021-06-07 MED ORDER — SODIUM CHLORIDE 0.9 % IV SOLN
2.0000 g | INTRAVENOUS | Status: AC
  Administered 2021-06-07: 2 g via INTRAVENOUS
  Filled 2021-06-07: qty 2

## 2021-06-07 MED ORDER — VERAPAMIL HCL ER 240 MG PO TBCR
240.0000 mg | EXTENDED_RELEASE_TABLET | Freq: Every day | ORAL | Status: DC
  Administered 2021-06-07 – 2021-06-09 (×3): 240 mg via ORAL
  Filled 2021-06-07 (×3): qty 1

## 2021-06-07 MED ORDER — LIDOCAINE 2% (20 MG/ML) 5 ML SYRINGE
INTRAMUSCULAR | Status: DC | PRN
  Administered 2021-06-07: 80 mg via INTRAVENOUS

## 2021-06-07 MED ORDER — PROPOFOL 10 MG/ML IV BOLUS
INTRAVENOUS | Status: DC | PRN
  Administered 2021-06-07: 120 mg via INTRAVENOUS

## 2021-06-07 MED ORDER — PROPOFOL 500 MG/50ML IV EMUL
INTRAVENOUS | Status: AC
  Filled 2021-06-07: qty 50

## 2021-06-07 MED ORDER — DIAZEPAM 2 MG PO TABS: 2.0000 mg | ORAL_TABLET | Freq: Four times a day (QID) | ORAL | Status: DC | PRN

## 2021-06-07 MED ORDER — AMISULPRIDE (ANTIEMETIC) 5 MG/2ML IV SOLN: 10.0000 mg | Freq: Once | INTRAVENOUS | Status: DC | PRN

## 2021-06-07 MED ORDER — DIPHENHYDRAMINE HCL 12.5 MG/5ML PO ELIX: 12.5000 mg | ORAL_SOLUTION | Freq: Four times a day (QID) | ORAL | Status: DC | PRN

## 2021-06-07 MED ORDER — BUPIVACAINE-EPINEPHRINE (PF) 0.25% -1:200000 IJ SOLN
INTRAMUSCULAR | Status: AC
  Filled 2021-06-07: qty 30

## 2021-06-07 MED ORDER — CHLORHEXIDINE GLUCONATE 0.12 % MT SOLN
15.0000 mL | Freq: Once | OROMUCOSAL | Status: AC
  Administered 2021-06-07: 15 mL via OROMUCOSAL

## 2021-06-07 MED ORDER — DIPHENHYDRAMINE HCL 50 MG/ML IJ SOLN: 12.5000 mg | Freq: Four times a day (QID) | INTRAMUSCULAR | Status: DC | PRN

## 2021-06-07 MED ORDER — ORAL CARE MOUTH RINSE: 15.0000 mL | Freq: Once | OROMUCOSAL | Status: AC

## 2021-06-07 MED ORDER — GABAPENTIN 300 MG PO CAPS
300.0000 mg | ORAL_CAPSULE | ORAL | Status: AC
  Administered 2021-06-07: 300 mg via ORAL
  Filled 2021-06-07: qty 1

## 2021-06-07 MED ORDER — BUPROPION HCL ER (XL) 150 MG PO TB24
150.0000 mg | ORAL_TABLET | Freq: Every morning | ORAL | Status: DC
  Administered 2021-06-08 – 2021-06-10 (×3): 150 mg via ORAL
  Filled 2021-06-07 (×3): qty 1

## 2021-06-07 MED ORDER — OXYCODONE HCL 5 MG PO TABS: 5.0000 mg | ORAL_TABLET | Freq: Once | ORAL | Status: DC | PRN

## 2021-06-07 MED ORDER — TOPIRAMATE 25 MG PO TABS: 50.0000 mg | ORAL_TABLET | Freq: Every evening | ORAL | Status: DC | PRN

## 2021-06-07 MED ORDER — BUPIVACAINE-EPINEPHRINE 0.25% -1:200000 IJ SOLN
INTRAMUSCULAR | Status: DC | PRN
  Administered 2021-06-07: 30 mL

## 2021-06-07 MED ORDER — PHENYLEPHRINE 40 MCG/ML (10ML) SYRINGE FOR IV PUSH (FOR BLOOD PRESSURE SUPPORT)
PREFILLED_SYRINGE | INTRAVENOUS | Status: DC | PRN
  Administered 2021-06-07 (×2): 120 ug via INTRAVENOUS

## 2021-06-07 MED ORDER — BUPIVACAINE LIPOSOME 1.3 % IJ SUSP
INTRAMUSCULAR | Status: DC | PRN
  Administered 2021-06-07: 20 mL

## 2021-06-07 MED ORDER — ALUM & MAG HYDROXIDE-SIMETH 200-200-20 MG/5ML PO SUSP: 30.0000 mL | Freq: Four times a day (QID) | ORAL | Status: DC | PRN

## 2021-06-07 SURGICAL SUPPLY — 94 items
BAG COUNTER SPONGE SURGICOUNT (BAG) ×2 IMPLANT
BAG SPNG CNTER NS LX DISP (BAG) ×1
BLADE EXTENDED COATED 6.5IN (ELECTRODE) IMPLANT
CANNULA REDUC XI 12-8 STAPL (CANNULA) ×2
CANNULA REDUCER 12-8 DVNC XI (CANNULA) IMPLANT
CELLS DAT CNTRL 66122 CELL SVR (MISCELLANEOUS) IMPLANT
COVER SURGICAL LIGHT HANDLE (MISCELLANEOUS) ×3 IMPLANT
COVER TIP SHEARS 8 DVNC (MISCELLANEOUS) ×1 IMPLANT
COVER TIP SHEARS 8MM DA VINCI (MISCELLANEOUS) ×2
DECANTER SPIKE VIAL GLASS SM (MISCELLANEOUS) IMPLANT
DRAIN CHANNEL 19F RND (DRAIN) IMPLANT
DRAPE ARM DVNC X/XI (DISPOSABLE) ×4 IMPLANT
DRAPE COLUMN DVNC XI (DISPOSABLE) ×1 IMPLANT
DRAPE DA VINCI XI ARM (DISPOSABLE) ×8
DRAPE DA VINCI XI COLUMN (DISPOSABLE) ×2
DRAPE SURG IRRIG POUCH 19X23 (DRAPES) ×2 IMPLANT
DRSG OPSITE POSTOP 4X10 (GAUZE/BANDAGES/DRESSINGS) IMPLANT
DRSG OPSITE POSTOP 4X6 (GAUZE/BANDAGES/DRESSINGS) ×1 IMPLANT
DRSG OPSITE POSTOP 4X8 (GAUZE/BANDAGES/DRESSINGS) IMPLANT
ELECT PENCIL ROCKER SW 15FT (MISCELLANEOUS) ×2 IMPLANT
ELECT REM PT RETURN 15FT ADLT (MISCELLANEOUS) ×2 IMPLANT
ENDOLOOP SUT PDS II  0 18 (SUTURE)
ENDOLOOP SUT PDS II 0 18 (SUTURE) IMPLANT
EVACUATOR SILICONE 100CC (DRAIN) IMPLANT
GLOVE SURG ENC MOIS LTX SZ6.5 (GLOVE) ×6 IMPLANT
GLOVE SURG UNDER POLY LF SZ7 (GLOVE) ×4 IMPLANT
GOWN STRL REUS W/TWL XL LVL3 (GOWN DISPOSABLE) ×6 IMPLANT
GRASPER SUT TROCAR 14GX15 (MISCELLANEOUS) IMPLANT
HOLDER FOLEY CATH W/STRAP (MISCELLANEOUS) ×2 IMPLANT
IRRIG SUCT STRYKERFLOW 2 WTIP (MISCELLANEOUS) ×2
IRRIGATION SUCT STRKRFLW 2 WTP (MISCELLANEOUS) ×1 IMPLANT
KIT PROCEDURE DA VINCI SI (MISCELLANEOUS)
KIT PROCEDURE DVNC SI (MISCELLANEOUS) IMPLANT
KIT TURNOVER KIT A (KITS) ×2 IMPLANT
NDL INSUFFLATION 14GA 120MM (NEEDLE) ×1 IMPLANT
NEEDLE INSUFFLATION 14GA 120MM (NEEDLE) ×2 IMPLANT
PACK CARDIOVASCULAR III (CUSTOM PROCEDURE TRAY) ×2 IMPLANT
PACK COLON (CUSTOM PROCEDURE TRAY) ×2 IMPLANT
PAD POSITIONING PINK XL (MISCELLANEOUS) ×2 IMPLANT
RELOAD STAPLE 60 2.5 WHT DVNC (STAPLE) IMPLANT
RELOAD STAPLE 60 3.5 BLU DVNC (STAPLE) IMPLANT
RELOAD STAPLE 60 4.3 GRN DVNC (STAPLE) IMPLANT
RELOAD STAPLER 2.5X60 WHT DVNC (STAPLE) ×1 IMPLANT
RELOAD STAPLER 3.5X60 BLU DVNC (STAPLE) ×1 IMPLANT
RELOAD STAPLER 4.3X60 GRN DVNC (STAPLE) ×1 IMPLANT
RETRACTOR WND ALEXIS 18 MED (MISCELLANEOUS) IMPLANT
RTRCTR WOUND ALEXIS 18CM MED (MISCELLANEOUS)
SCISSORS LAP 5X35 DISP (ENDOMECHANICALS) IMPLANT
SEAL CANN UNIV 5-8 DVNC XI (MISCELLANEOUS) ×3 IMPLANT
SEAL XI 5MM-8MM UNIVERSAL (MISCELLANEOUS) ×6
SEALER VESSEL DA VINCI XI (MISCELLANEOUS) ×2
SEALER VESSEL EXT DVNC XI (MISCELLANEOUS) ×1 IMPLANT
SOLUTION ELECTROLUBE (MISCELLANEOUS) ×2 IMPLANT
STAPLER 60 DA VINCI SURE FORM (STAPLE) ×2
STAPLER 60 SUREFORM DVNC (STAPLE) IMPLANT
STAPLER CANNULA SEAL DVNC XI (STAPLE) IMPLANT
STAPLER CANNULA SEAL XI (STAPLE) ×2
STAPLER ECHELON POWER CIR 29 (STAPLE) IMPLANT
STAPLER ECHELON POWER CIR 31 (STAPLE) IMPLANT
STAPLER RELOAD 2.5X60 WHITE (STAPLE) ×2
STAPLER RELOAD 2.5X60 WHT DVNC (STAPLE) ×1
STAPLER RELOAD 3.5X60 BLU DVNC (STAPLE) ×1
STAPLER RELOAD 3.5X60 BLUE (STAPLE) ×2
STAPLER RELOAD 4.3X60 GREEN (STAPLE) ×2
STAPLER RELOAD 4.3X60 GRN DVNC (STAPLE) ×1
STOPCOCK 4 WAY LG BORE MALE ST (IV SETS) ×4 IMPLANT
SUT ETHILON 2 0 PS N (SUTURE) IMPLANT
SUT NOVA NAB DX-16 0-1 5-0 T12 (SUTURE) ×4 IMPLANT
SUT PROLENE 2 0 KS (SUTURE) IMPLANT
SUT SILK 2 0 (SUTURE) ×2
SUT SILK 2 0 SH CR/8 (SUTURE) IMPLANT
SUT SILK 2-0 18XBRD TIE 12 (SUTURE) ×1 IMPLANT
SUT SILK 3 0 (SUTURE)
SUT SILK 3 0 SH CR/8 (SUTURE) ×2 IMPLANT
SUT SILK 3-0 18XBRD TIE 12 (SUTURE) IMPLANT
SUT V-LOC BARB 180 2/0GR6 GS22 (SUTURE) ×4
SUT VIC AB 2-0 SH 18 (SUTURE) IMPLANT
SUT VIC AB 2-0 SH 27 (SUTURE) ×2
SUT VIC AB 2-0 SH 27X BRD (SUTURE) IMPLANT
SUT VIC AB 3-0 SH 18 (SUTURE) IMPLANT
SUT VIC AB 4-0 PS2 27 (SUTURE) ×4 IMPLANT
SUT VICRYL 0 UR6 27IN ABS (SUTURE) ×2 IMPLANT
SUTURE V-LC BRB 180 2/0GR6GS22 (SUTURE) IMPLANT
SYR 10ML ECCENTRIC (SYRINGE) ×2 IMPLANT
SYS LAPSCP GELPORT 120MM (MISCELLANEOUS)
SYS WOUND ALEXIS 18CM MED (MISCELLANEOUS) ×2
SYSTEM LAPSCP GELPORT 120MM (MISCELLANEOUS) IMPLANT
SYSTEM WOUND ALEXIS 18CM MED (MISCELLANEOUS) IMPLANT
TOWEL OR 17X26 10 PK STRL BLUE (TOWEL DISPOSABLE) ×1 IMPLANT
TOWEL OR NON WOVEN STRL DISP B (DISPOSABLE) ×2 IMPLANT
TRAY FOLEY MTR SLVR 16FR STAT (SET/KITS/TRAYS/PACK) ×2 IMPLANT
TROCAR ADV FIXATION 5X100MM (TROCAR) ×2 IMPLANT
TUBING CONNECTING 10 (TUBING) ×3 IMPLANT
TUBING INSUFFLATION 10FT LAP (TUBING) ×2 IMPLANT

## 2021-06-07 NOTE — Interval H&P Note (Signed)
History and Physical Interval Note:  06/07/2021 7:10 AM  Tina Price  has presented today for surgery, with the diagnosis of proximal transverse colon cancer.  The various methods of treatment have been discussed with the patient and family. After consideration of risks, benefits and other options for treatment, the patient has consented to  Procedure(s): XI ROBOT ASSISTED LAPAROSCOPIC PARTIAL COLECTOMY (N/A) as a surgical intervention.  The patient's history has been reviewed, patient examined, no change in status, stable for surgery.  I have reviewed the patient's chart and labs.  Questions were answered to the patient's satisfaction.     Rosario Adie, MD  [facility]

## 2021-06-07 NOTE — Anesthesia Procedure Notes (Signed)
Procedure Name: Intubation Date/Time: 06/07/2021 7:27 AM Performed by: Claudia Desanctis, CRNA Pre-anesthesia Checklist: Patient identified, Emergency Drugs available, Suction available and Patient being monitored Patient Re-evaluated:Patient Re-evaluated prior to induction Oxygen Delivery Method: Circle system utilized Preoxygenation: Pre-oxygenation with 100% oxygen Induction Type: IV induction and Rapid sequence Laryngoscope Size: 2 and Miller Grade View: Grade I Tube type: Oral Tube size: 7.0 mm Number of attempts: 1 Airway Equipment and Method: Stylet Placement Confirmation: ETT inserted through vocal cords under direct vision, positive ETCO2 and breath sounds checked- equal and bilateral Secured at: 20 cm Tube secured with: Tape Dental Injury: Teeth and Oropharynx as per pre-operative assessment  Comments: Rapid sequence performed with no mask ventilation due to pt nausea

## 2021-06-07 NOTE — Transfer of Care (Signed)
Immediate Anesthesia Transfer of Care Note  Patient: Tina Price  Procedure(s) Performed: ROBOTIC RIGHTCOLECTOMY (Abdomen)  Patient Location: PACU  Anesthesia Type:General  Level of Consciousness: awake and patient cooperative  Airway & Oxygen Therapy: Patient Spontanous Breathing and Patient connected to face mask  Post-op Assessment: Report given to RN and Post -op Vital signs reviewed and stable  Post vital signs: Reviewed and stable  Last Vitals:  Vitals Value Taken Time  BP 176/68 06/07/21 0957  Temp    Pulse 62 06/07/21 1000  Resp 15 06/07/21 1000  SpO2 100 % 06/07/21 1000  Vitals shown include unvalidated device data.  Last Pain:  Vitals:   06/07/21 0613  TempSrc:   PainSc: 0-No pain         Complications: No notable events documented.

## 2021-06-07 NOTE — Op Note (Signed)
06/07/2021  9:56 AM  PATIENT:  Tina Price  80 y.o. female  Patient Care Team: Lawerance Cruel, MD as PCP - General (Family Medicine)  PRE-OPERATIVE DIAGNOSIS:  proximal transverse colon cancer  POST-OPERATIVE DIAGNOSIS:  proximal transverse colon cancer  PROCEDURE:  ROBOTIC RIGHTCOLECTOMY   Surgeon(s): Leighton Ruff, MD Stechschulte, Nickola Major, MD  ASSISTANT:  Stechschulte, Nickola Major, MD  ANESTHESIA:   local and general  EBL: 54m  Total I/O In: 1100 [I.V.:1000; IV Piggyback:100] Out: 120 [Urine:100; Blood:20]  Delay start of Pharmacological VTE agent (>24hrs) due to surgical blood loss or risk of bleeding:  no  DRAINS: none   SPECIMEN:  Source of Specimen:  ascending colon  DISPOSITION OF SPECIMEN:  PATHOLOGY  COUNTS:  YES  PLAN OF CARE: Admit to inpatient   PATIENT DISPOSITION:  PACU - hemodynamically stable.  INDICATION:    80y.o. F with proximal transverse colon cancer.  I recommended segmental resection:  The anatomy & physiology of the digestive tract was discussed.  The pathophysiology was discussed.  Natural history risks without surgery was discussed.   I worked to give an overview of the disease and the frequent need to have multispecialty involvement.  I feel the risks of no intervention will lead to serious problems that outweigh the operative risks; therefore, I recommended a partial colectomy to remove the pathology.  Laparoscopic & open techniques were discussed.   Risks such as bleeding, infection, abscess, leak, reoperation, possible ostomy, hernia, heart attack, death, and other risks were discussed.  I noted a good likelihood this will help address the problem.   Goals of post-operative recovery were discussed as well.    The patient expressed understanding & wished to proceed with surgery.  OR FINDINGS:   Patient had mass noted in proximal transverse colon  No obvious metastatic disease on visceral parietal peritoneum or  liver.  DESCRIPTION:   Informed consent was confirmed.  The patient underwent general anaesthesia without difficulty.  The patient was positioned appropriately.  VTE prevention in place.  The patient's abdomen was clipped, prepped, & draped in a sterile fashion.  Surgical timeout confirmed our plan.  The patient was positioned in reverse Trendelenburg.  Abdominal entry was gained using a Varies needle in the LUQ.  Entry was clean.  I induced carbon dioxide insufflation.  An 861mrobotic port was placed in the LUQ.  Camera inspection revealed no injury.  Extra ports were carefully placed under direct laparoscopic visualization.  I laparoscopically reflected the greater omentum and the upper abdomen the small bowel in the peilvis. The patient was appropriately positioned and the robot was docked to the patient's right side.  Instruments were placed under direct visualization.   The patient had transverse colon that was adherent to the ascending colon causing the cecum to be located in the right upper quadrant.  These adhesions were taken down and the omentum was separated from the colon using robotic vessel sealer.  Once normal anatomy was established, I began by entering into the mesenteric plane under the ileocolic artery and vein within the mesentery. Dissection was bluntly carried around these structures. The duodenum was identified and free from the structures. I then separated the structures bluntly and used the robotic vessel sealer device to transect these.  I developed the retroperitoneal plane bluntly.  I then freed the appendix off its attachments to the pelvic wall. I mobilized the terminal ileum.  I took care to avoid injuring any retroperitoneal structures.  After this  I began to mobilize laterally down the white line of [place] and then took down the hepatic flexure using the robotic vessel sealer device. I mobilized the omentum off of the right transverse colon. The entire colon was then flipped  medially and mobilized off of the retroperitoneal structures until I could visualize the lateral edge of the duodenum underneath.  I gently freed the duodenal attachments.   I identified a portion of mesentery of the transverse colon just proximal to the right branch of the middle colic and distal to the tattoo.  I divided up to the colon from the previous dissection of the mesentery using a robotic vessel sealer.  I then divided the terminal ileal mesentery in similar fashion with a robotic vessel sealer.  At that point, the terminal ileum was divided with a blue load robotic 60 mm stapler.  The transverse colon was divided with a green load robotic stapler.  The specimen was then completely free and placed in the right upper quadrant.  Hemostasis was good.  I then oriented the remaining terminal ileum and transverse colon and a isoperistaltic fashion.  I placed an enterotomy in the small bowel and colon using the robotic scissors.  I then introduced a white load 60 mm robotic stapler into both enterotomies and created an anastomosis between the small bowel and transverse colon.  Hemostasis within the staple line was good.  The common enterotomy channel was closed using 2 running 2-0 V-Loc sutures.  The abdomen was then irrigated with normal saline. The omentum was then brought down over the anastomosis.  At this point the robot was undocked.  The 12 mm suprapubic port was enlarged to a Pfannenstiel incision and an [place] wound protector was placed.  The specimen was removed from the abdomen and evaluated.  Once the abdomen was inspected for hemostasis, the [place] wound protector was removed.    The peritoneum of the Pfannenstiel incision was closed using a running 0 Vicryl suture.  The fascia was then closed using #1 Novafil interrupted sutures.  The subcutaneous tissue of the extraction incision was closed using a running 2-0 Vicryl suture. The skin was then closed using running subcuticular 4-0 Vicryl  sutures.  A sterile dressing was applied.  The remaining port sites were closed using interrupted 4-0 Vicryl sutures and Dermabond. All counts were correct per operating room staff. The patient was then awakened from anesthesia and sent to the post anesthesia care unit in stable condition.

## 2021-06-08 LAB — CBC
HCT: 29.5 % — ABNORMAL LOW (ref 36.0–46.0)
Hemoglobin: 9.6 g/dL — ABNORMAL LOW (ref 12.0–15.0)
MCH: 31.1 pg (ref 26.0–34.0)
MCHC: 32.5 g/dL (ref 30.0–36.0)
MCV: 95.5 fL (ref 80.0–100.0)
Platelets: 192 10*3/uL (ref 150–400)
RBC: 3.09 MIL/uL — ABNORMAL LOW (ref 3.87–5.11)
RDW: 14.6 % (ref 11.5–15.5)
WBC: 6.6 10*3/uL (ref 4.0–10.5)
nRBC: 0 % (ref 0.0–0.2)

## 2021-06-08 LAB — BASIC METABOLIC PANEL
Anion gap: 7 (ref 5–15)
BUN: 11 mg/dL (ref 8–23)
CO2: 27 mmol/L (ref 22–32)
Calcium: 8.6 mg/dL — ABNORMAL LOW (ref 8.9–10.3)
Chloride: 103 mmol/L (ref 98–111)
Creatinine, Ser: 0.93 mg/dL (ref 0.44–1.00)
GFR, Estimated: >60 mL/min (ref >60)
Glucose, Bld: 105 mg/dL — ABNORMAL HIGH (ref 70–99)
Potassium: 4.1 mmol/L (ref 3.5–5.1)
Sodium: 137 mmol/L (ref 135–145)

## 2021-06-08 NOTE — Progress Notes (Signed)
Progress Note: General Surgery Service   Chief Complaint/Subjective: Doing well this morning.  Some soreness in RUQ.  No nausea/vomiting, tolerating liquids.  Some flatus, no bowel movements.  Objective: Vital signs in last 24 hours: Temp:  [97.9 F (36.6 C)-98.9 F (37.2 C)] 98.3 F (36.8 C) (08/27 0529) Pulse Rate:  [61-75] 68 (08/27 0529) Resp:  [10-24] 16 (08/27 0529) BP: (118-176)/(50-73) 118/50 (08/27 0529) SpO2:  [92 %-100 %] 97 % (08/27 0529) Last BM Date: 06/06/21  Intake/Output from previous day: 08/26 0701 - 08/27 0700 In: 3788.4 [P.O.:960; I.V.:2628.4; IV Piggyback:200] Out: 2120 [Urine:2100; Blood:20] Intake/Output this shift: No intake/output data recorded.   GI: Abd Incisions c/d/I w/ glue and dressing, some tenderness around incisions and RUQ  Lab Results: CBC  Recent Labs    06/08/21 0534  WBC 6.6  HGB 9.6*  HCT 29.5*  PLT 192   BMET Recent Labs    06/08/21 0534  NA 137  K 4.1  CL 103  CO2 27  GLUCOSE 105*  BUN 11  CREATININE 0.93  CALCIUM 8.6*   PT/INR No results for input(s): LABPROT, INR in the last 72 hours. ABG No results for input(s): PHART, HCO3 in the last 72 hours.  Invalid input(s): PCO2, PO2  Anti-infectives: Anti-infectives (From admission, onward)    Start     Dose/Rate Route Frequency Ordered Stop   06/07/21 2200  nitrofurantoin (MACRODANTIN) capsule 100 mg        100 mg Oral Daily at bedtime 06/07/21 1105     06/07/21 1930  cefoTEtan (CEFOTAN) 2 g in sodium chloride 0.9 % 100 mL IVPB        2 g 200 mL/hr over 30 Minutes Intravenous Every 12 hours 06/07/21 1105 06/07/21 1959   06/07/21 0600  cefoTEtan (CEFOTAN) 2 g in sodium chloride 0.9 % 100 mL IVPB        2 g 200 mL/hr over 30 Minutes Intravenous On call to O.R. 06/07/21 0541 06/07/21 0745       Medications: Scheduled Meds:  acetaminophen  1,000 mg Oral Q6H   alvimopan  12 mg Oral BID   buPROPion  150 mg Oral q morning   enoxaparin (LOVENOX) injection  40  mg Subcutaneous Q24H   feeding supplement  237 mL Oral BID BM   gabapentin  300 mg Oral BID   hydrochlorothiazide  12.5 mg Oral Daily   mirabegron ER  50 mg Oral Daily   nitrofurantoin  100 mg Oral QHS   saccharomyces boulardii  250 mg Oral BID   sertraline  50 mg Oral Daily   verapamil  240 mg Oral QHS   Continuous Infusions:  dextrose 5 % and 0.45 % NaCl with KCl 20 mEq/L 75 mL/hr at 06/08/21 0218   PRN Meds:.alum & mag hydroxide-simeth, diazepam, diphenhydrAMINE **OR** diphenhydrAMINE, HYDROmorphone (DILAUDID) injection, ondansetron **OR** ondansetron (ZOFRAN) IV, simethicone, topiramate, traMADol  Assessment/Plan: Tina Price is a 80 year old female with colon cancer near the hepatic flexure s/p robotic right colectomy on 06/07/21.  Doing well postoperative day 1 Remove foley Stop IVF Advance to full liquid diet Increase activity Awaiting return of bowel function   LOS: 1 day   FEN: Fulls ID: None VTE: Lovenox, SCDs Foley: Remove Dispo: Continued care on floor    Felicie Morn, MD  [facility] Use [email or web address] to contact on call provider

## 2021-06-08 NOTE — Evaluation (Signed)
Physical Therapy Evaluation Patient Details Name: Tina Price MRN: [id number] DOB: 11/27/1940 Today's Date: 06/08/2021   History of Present Illness  Pt s/p colectomy 2* colon CA.  Pt with hx of chronic back pain, chronic UTI, lumbar fusion and dysphonia.  Clinical Impression  Pt admitted as above and presenting with functional mobility limitations 2* post op abdominal pain and ambulatory balance deficits.  Pt should progress to dc home with 24/7 assist of family.    Follow Up Recommendations No PT follow up    Equipment Recommendations  None recommended by PT    Recommendations for Other Services       Precautions / Restrictions Precautions Precautions: Fall Precaution Comments: s/p abdominal surgery Restrictions Weight Bearing Restrictions: No      Mobility  Bed Mobility Overal bed mobility: Needs Assistance Bed Mobility: Rolling;Sidelying to Sit;Sit to Sidelying Rolling: Min assist Sidelying to sit: Min assist     Sit to sidelying: Min assist;Mod assist General bed mobility comments: cues for log roll technique and assist to manage LEs    Transfers Overall transfer level: Needs assistance Equipment used: Rolling walker (2 wheeled) Transfers: Sit to/from Stand Sit to Stand: Min assist         General transfer comment: cues for LE management and use of UEs to self assist; Assist to bring wt up and fwd and to balance on initial standing  Ambulation/Gait Ambulation/Gait assistance: Min assist;Min guard Gait Distance (Feet): 200 Feet Assistive device: Rolling walker (2 wheeled) Gait Pattern/deviations: Step-to pattern;Step-through pattern;Decreased step length - right;Decreased step length - left;Shuffle;Trunk flexed     General Gait Details: cues for posture and position from RW.  General instability noted initially requiring min assist to correct but with increased distance progressing to min guard.  Stairs            Wheelchair Mobility     Modified Rankin (Stroke Patients Only)       Balance Overall balance assessment: Needs assistance Sitting-balance support: Feet supported;No upper extremity supported Sitting balance-Leahy Scale: Fair     Standing balance support: Bilateral upper extremity supported Standing balance-Leahy Scale: Poor                               Pertinent Vitals/Pain Pain Assessment: 0-10 Pain Score: 5  Pain Location: abdomen Pain Descriptors / Indicators: Sore Pain Intervention(s): Limited activity within patient's tolerance;Monitored during session    Home Living Family/patient expects to be discharged to:: Private residence Living Arrangements: Alone Available Help at Discharge: Family;Available 24 hours/day Type of Home: House Home Access: Stairs to enter Entrance Stairs-Rails: Psychiatric nurse of Steps: 2 Home Layout: One level Home Equipment: Environmental consultant - 2 wheels Additional Comments: DTR and DTR in law will be assisting    Prior Function Level of Independence: Independent               Hand Dominance        Extremity/Trunk Assessment   Upper Extremity Assessment Upper Extremity Assessment: Overall WFL for tasks assessed    Lower Extremity Assessment Lower Extremity Assessment: Overall WFL for tasks assessed       Communication   Communication: Expressive difficulties (Voice dysphonia)  Cognition Arousal/Alertness: Awake/alert Behavior During Therapy: WFL for tasks assessed/performed Overall Cognitive Status: Within Functional Limits for tasks assessed  General Comments      Exercises     Assessment/Plan    PT Assessment Patient needs continued PT services  PT Problem List Decreased activity tolerance;Decreased balance;Decreased mobility;Decreased knowledge of use of DME;Pain       PT Treatment Interventions DME instruction;Gait training;Stair training;Functional  mobility training;Therapeutic activities;Therapeutic exercise;Balance training;Patient/family education    PT Goals (Current goals can be found in the Care Plan section)  Acute Rehab PT Goals Patient Stated Goal: Regain IND PT Goal Formulation: With patient Time For Goal Achievement: 06/21/21 Potential to Achieve Goals: Good    Frequency Min 3X/week   Barriers to discharge        Co-evaluation               AM-PAC PT "6 Clicks" Mobility  Outcome Measure Help needed turning from your back to your side while in a flat bed without using bedrails?: A Little Help needed moving from lying on your back to sitting on the side of a flat bed without using bedrails?: A Little Help needed moving to and from a bed to a chair (including a wheelchair)?: A Little Help needed standing up from a chair using your arms (e.g., wheelchair or bedside chair)?: A Little Help needed to walk in hospital room?: A Little Help needed climbing 3-5 steps with a railing? : A Little 6 Click Score: 18    End of Session Equipment Utilized During Treatment: Gait belt Activity Tolerance: Patient tolerated treatment well Patient left: in bed;with call bell/phone within reach;with bed alarm set Nurse Communication: Mobility status PT Visit Diagnosis: Difficulty in walking, not elsewhere classified (R26.2);Pain Pain - part of body:  (abdomen)    Time: [id number] PT Time Calculation (min) (ACUTE ONLY): 22 min   Charges:   PT Evaluation $PT Eval Low Complexity: 1 Low          Debe Coder PT Acute Rehabilitation Services Pager [phone] Office [phone]   Kin Galbraith 06/08/2021, 5:20 PM

## 2021-06-09 LAB — CBC
HCT: 30.8 % — ABNORMAL LOW (ref 36.0–46.0)
Hemoglobin: 9.9 g/dL — ABNORMAL LOW (ref 12.0–15.0)
MCH: 30.8 pg (ref 26.0–34.0)
MCHC: 32.1 g/dL (ref 30.0–36.0)
MCV: 96 fL (ref 80.0–100.0)
Platelets: 201 10*3/uL (ref 150–400)
RBC: 3.21 MIL/uL — ABNORMAL LOW (ref 3.87–5.11)
RDW: 14.8 % (ref 11.5–15.5)
WBC: 5.6 10*3/uL (ref 4.0–10.5)
nRBC: 0 % (ref 0.0–0.2)

## 2021-06-09 LAB — BASIC METABOLIC PANEL
Anion gap: 7 (ref 5–15)
BUN: 10 mg/dL (ref 8–23)
CO2: 29 mmol/L (ref 22–32)
Calcium: 8.9 mg/dL (ref 8.9–10.3)
Chloride: 106 mmol/L (ref 98–111)
Creatinine, Ser: 0.79 mg/dL (ref 0.44–1.00)
GFR, Estimated: >60 mL/min (ref >60)
Glucose, Bld: 102 mg/dL — ABNORMAL HIGH (ref 70–99)
Potassium: 3.9 mmol/L (ref 3.5–5.1)
Sodium: 142 mmol/L (ref 135–145)

## 2021-06-09 NOTE — Progress Notes (Signed)
Physical Therapy Treatment Patient Details Name: Tina Price MRN: [id number] DOB: 30-Jan-1941 Today's Date: 06/09/2021    History of Present Illness Pt s/p colectomy 2* colon CA.  Pt with hx of chronic back pain, chronic UTI, lumbar fusion and dysphonia.    PT Comments    Pt received in recliner. Min assist sit to stand, min guard assist ambulation 225' with RW, and min assist sit to supine. Pt in bed at end of session. Pt reports having a high bed at home with a wooden step to assist with getting into bed. Pt educated on using step as a boost once she is already high sitting EOB. Step not needed for OOB. Pt's daughter present and engaged in session.    Follow Up Recommendations  No PT follow up     Equipment Recommendations  None recommended by PT    Recommendations for Other Services       Precautions / Restrictions Precautions Precautions: Fall Precaution Comments: s/p abdominal surgery    Mobility  Bed Mobility Overal bed mobility: Needs Assistance Bed Mobility: Sit to Supine       Sit to supine: Min assist   General bed mobility comments: increased time, assist with BLE into bed    Transfers Overall transfer level: Needs assistance Equipment used: Rolling walker (2 wheeled) Transfers: Sit to/from Stand Sit to Stand: Min assist         General transfer comment: increased time  Ambulation/Gait Ambulation/Gait assistance: Min guard Gait Distance (Feet): 225 Feet Assistive device: Rolling walker (2 wheeled) Gait Pattern/deviations: Step-through pattern;Decreased stride length Gait velocity: decreased Gait velocity interpretation: <1.31 ft/sec, indicative of household ambulator General Gait Details: steady gait with RW   Stairs             Wheelchair Mobility    Modified Rankin (Stroke Patients Only)       Balance Overall balance assessment: Needs assistance Sitting-balance support: Feet supported;No upper extremity  supported Sitting balance-Leahy Scale: Good     Standing balance support: Bilateral upper extremity supported;No upper extremity supported;During functional activity Standing balance-Leahy Scale: Fair Standing balance comment: static stand without UE support, RW for amb                            Cognition Arousal/Alertness: Awake/alert Behavior During Therapy: WFL for tasks assessed/performed Overall Cognitive Status: Within Functional Limits for tasks assessed                                        Exercises      General Comments        Pertinent Vitals/Pain Pain Assessment: Faces Faces Pain Scale: Hurts little more Pain Location: abdomen Pain Descriptors / Indicators: Discomfort;Sore Pain Intervention(s): Limited activity within patient's tolerance;Repositioned;Monitored during session    Home Living                      Prior Function            PT Goals (current goals can now be found in the care plan section) Acute Rehab PT Goals Patient Stated Goal: Regain IND Progress towards PT goals: Progressing toward goals    Frequency    Min 3X/week      PT Plan Current plan remains appropriate    Co-evaluation  AM-PAC PT "6 Clicks" Mobility   Outcome Measure  Help needed turning from your back to your side while in a flat bed without using bedrails?: A Little Help needed moving from lying on your back to sitting on the side of a flat bed without using bedrails?: A Little Help needed moving to and from a bed to a chair (including a wheelchair)?: A Little Help needed standing up from a chair using your arms (e.g., wheelchair or bedside chair)?: A Little Help needed to walk in hospital room?: A Little Help needed climbing 3-5 steps with a railing? : A Little 6 Click Score: 18    End of Session   Activity Tolerance: Patient tolerated treatment well Patient left: in bed;with call bell/phone within  reach;with family/visitor present Nurse Communication: Mobility status PT Visit Diagnosis: Difficulty in walking, not elsewhere classified (R26.2);Pain     Time: [id number] PT Time Calculation (min) (ACUTE ONLY): 24 min  Charges:  $Gait Training: 23-37 mins                     Lorrin Goodell, [place]  Office # [phone] Pager [place], [place] 06/09/2021, 1:34 PM

## 2021-06-09 NOTE — Progress Notes (Signed)
Progress Note: General Surgery Service   Chief Complaint/Subjective: Had some bowel movements.  Tolerating diet.  Some pain overnight, controlled with tramadol  Objective: Vital signs in last 24 hours: Temp:  [97.6 F (36.4 C)-98.7 F (37.1 C)] 98.7 F (37.1 C) (08/28 0535) Pulse Rate:  [58-71] 58 (08/28 0535) Resp:  [18-20] 18 (08/28 0535) BP: (115-146)/(50-57) 127/57 (08/28 0535) SpO2:  [95 %-100 %] 95 % (08/28 0535) Last BM Date: 06/08/21  Intake/Output from previous day: 08/27 0701 - 08/28 0700 In: 749.5 [P.O.:470; I.V.:279.5] Out: 200 [Urine:200] Intake/Output this shift: No intake/output data recorded.   GI: Abd Incisions c/d/I w/ glue and dressing, some tenderness around incisions and RUQ  Lab Results: CBC  Recent Labs    06/08/21 0534 06/09/21 0447  WBC 6.6 5.6  HGB 9.6* 9.9*  HCT 29.5* 30.8*  PLT 192 201    BMET Recent Labs    06/08/21 0534 06/09/21 0447  NA 137 142  K 4.1 3.9  CL 103 106  CO2 27 29  GLUCOSE 105* 102*  BUN 11 10  CREATININE 0.93 0.79  CALCIUM 8.6* 8.9    PT/INR No results for input(s): LABPROT, INR in the last 72 hours. ABG No results for input(s): PHART, HCO3 in the last 72 hours.  Invalid input(s): PCO2, PO2  Anti-infectives: Anti-infectives (From admission, onward)    Start     Dose/Rate Route Frequency Ordered Stop   06/07/21 2200  nitrofurantoin (MACRODANTIN) capsule 100 mg        100 mg Oral Daily at bedtime 06/07/21 1105     06/07/21 1930  cefoTEtan (CEFOTAN) 2 g in sodium chloride 0.9 % 100 mL IVPB        2 g 200 mL/hr over 30 Minutes Intravenous Every 12 hours 06/07/21 1105 06/07/21 1959   06/07/21 0600  cefoTEtan (CEFOTAN) 2 g in sodium chloride 0.9 % 100 mL IVPB        2 g 200 mL/hr over 30 Minutes Intravenous On call to O.R. 06/07/21 0541 06/07/21 0745       Medications: Scheduled Meds:  acetaminophen  1,000 mg Oral Q6H   alvimopan  12 mg Oral BID   buPROPion  150 mg Oral q morning   enoxaparin  (LOVENOX) injection  40 mg Subcutaneous Q24H   feeding supplement  237 mL Oral BID BM   gabapentin  300 mg Oral BID   hydrochlorothiazide  12.5 mg Oral Daily   mirabegron ER  50 mg Oral Daily   nitrofurantoin  100 mg Oral QHS   saccharomyces boulardii  250 mg Oral BID   sertraline  50 mg Oral Daily   verapamil  240 mg Oral QHS   Continuous Infusions:   PRN Meds:.alum & mag hydroxide-simeth, diazepam, diphenhydrAMINE **OR** diphenhydrAMINE, HYDROmorphone (DILAUDID) injection, ondansetron **OR** ondansetron (ZOFRAN) IV, simethicone, topiramate, traMADol  Assessment/Plan: Ms. Tina Price is a 80 year old female with colon cancer near the hepatic flexure s/p robotic right colectomy on 06/07/21.  Doing well postoperative day 2 Stop IVF Regular diet Increase activity Pain control   LOS: 2 days   FEN: Reg diet ID: None VTE: Lovenox, SCDs Foley: Remove Dispo: Likely home tomorrow    Felicie Morn, MD  [facility] Use [email or web address] to contact on call provider

## 2021-06-10 LAB — BASIC METABOLIC PANEL
Anion gap: 9 (ref 5–15)
BUN: 10 mg/dL (ref 8–23)
CO2: 27 mmol/L (ref 22–32)
Calcium: 8.9 mg/dL (ref 8.9–10.3)
Chloride: 103 mmol/L (ref 98–111)
Creatinine, Ser: 0.87 mg/dL (ref 0.44–1.00)
GFR, Estimated: >60 mL/min (ref >60)
Glucose, Bld: 97 mg/dL (ref 70–99)
Potassium: 3.6 mmol/L (ref 3.5–5.1)
Sodium: 139 mmol/L (ref 135–145)

## 2021-06-10 LAB — CBC
HCT: 32.7 % — ABNORMAL LOW (ref 36.0–46.0)
Hemoglobin: 10.4 g/dL — ABNORMAL LOW (ref 12.0–15.0)
MCH: 30.7 pg (ref 26.0–34.0)
MCHC: 31.8 g/dL (ref 30.0–36.0)
MCV: 96.5 fL (ref 80.0–100.0)
Platelets: 228 10*3/uL (ref 150–400)
RBC: 3.39 MIL/uL — ABNORMAL LOW (ref 3.87–5.11)
RDW: 14.6 % (ref 11.5–15.5)
WBC: 5.5 10*3/uL (ref 4.0–10.5)
nRBC: 0 % (ref 0.0–0.2)

## 2021-06-10 MED ORDER — TRAMADOL HCL 50 MG PO TABS: 50.0000 mg | ORAL_TABLET | Freq: Four times a day (QID) | ORAL | 0 refills | Status: DC | PRN

## 2021-06-10 NOTE — Discharge Instructions (Signed)
SURGERY: POST OP INSTRUCTIONS (Surgery for small bowel obstruction, colon resection, etc)   ######################################################################  EAT Gradually transition to a high fiber diet with a fiber supplement over the next few days after discharge  WALK Walk an hour a day.  Control your pain to do that.    CONTROL PAIN Control pain so that you can walk, sleep, tolerate sneezing/coughing, go up/down stairs.  HAVE A BOWEL MOVEMENT DAILY Keep your bowels regular to avoid problems.  OK to try a laxative to override constipation.  OK to use an antidairrheal to slow down diarrhea.  Call if not better after 2 tries  CALL IF YOU HAVE PROBLEMS/CONCERNS Call if you are still struggling despite following these instructions. Call if you have concerns not answered by these instructions  ######################################################################   DIET Follow a light diet the first few days at home.  Start with a bland diet such as soups, liquids, starchy foods, low fat foods, etc.  If you feel full, bloated, or constipated, stay on a ful liquid or pureed/blenderized diet for a few days until you feel better and no longer constipated. Be sure to drink plenty of fluids every day to avoid getting dehydrated (feeling dizzy, not urinating, etc.). Gradually add a fiber supplement to your diet over the next week.  Gradually get back to a regular solid diet.  Avoid fast food or heavy meals the first week as you are more likely to get nauseated. It is expected for your digestive tract to need a few months to get back to normal.  It is common for your bowel movements and stools to be irregular.  You will have occasional bloating and cramping that should eventually fade away.  Until you are eating solid food normally, off all pain medications, and back to regular activities; your bowels will not be normal. Focus on eating a low-fat, high fiber diet the rest of your life  (See Getting to Good Bowel Health, below).  CARE of your INCISION or WOUND  It is good for closed incisions and even open wounds to be washed every day.  Shower every day.  Short baths are fine.  Wash the incisions and wounds clean with soap & water.    You may leave closed incisions open to air if it is dry.   You may cover the incision with clean gauze & replace it after your daily shower for comfort.  STAPLES: You have skin staples.  Leave them in place & set up an appointment for them to be removed by a surgery office nurse ~10 days after surgery. = 1st week of January 2024    ACTIVITIES as tolerated Start light daily activities --- self-care, walking, climbing stairs-- beginning the day after surgery.  Gradually increase activities as tolerated.  Control your pain to be active.  Stop when you are tired.  Ideally, walk several times a day, eventually an hour a day.   Most people are back to most day-to-day activities in a few weeks.  It takes 4-8 weeks to get back to unrestricted, intense activity. If you can walk 30 minutes without difficulty, it is safe to try more intense activity such as jogging, treadmill, bicycling, low-impact aerobics, swimming, etc. Save the most intensive and strenuous activity for last (Usually 4-8 weeks after surgery) such as sit-ups, heavy lifting, contact sports, etc.  Refrain from any intense heavy lifting or straining until you are off narcotics for pain control.  You will have off days, but things should improve   week-by-week. DO NOT PUSH THROUGH PAIN.  Let pain be your guide: If it hurts to do something, don't do it.  Pain is your body warning you to avoid that activity for another week until the pain goes down. You may drive when you are no longer taking narcotic prescription pain medication, you can comfortably wear a seatbelt, and you can safely make sudden turns/stops to protect yourself without hesitating due to pain. You may have sexual intercourse when it  is comfortable. If it hurts to do something, stop.  MEDICATIONS Take your usually prescribed home medications unless otherwise directed.   Blood thinners:  Usually you can restart any strong blood thinners after the second postoperative day.  It is OK to take aspirin right away.     If you are on strong blood thinners (warfarin/Coumadin, Plavix, Xerelto, Eliquis, Pradaxa, etc), discuss with your surgeon, medicine PCP, and/or cardiologist for instructions on when to restart the blood thinner & if blood monitoring is needed (PT/INR blood check, etc).     PAIN CONTROL Pain after surgery or related to activity is often due to strain/injury to muscle, tendon, nerves and/or incisions.  This pain is usually short-term and will improve in a few months.  To help speed the process of healing and to get back to regular activity more quickly, DO THE FOLLOWING THINGS TOGETHER: Increase activity gradually.  DO NOT PUSH THROUGH PAIN Use Ice and/or Heat Try Gentle Massage and/or Stretching Take over the counter pain medication Take Narcotic prescription pain medication for more severe pain  Good pain control = faster recovery.  It is better to take more medicine to be more active than to stay in bed all day to avoid medications.  Increase activity gradually Avoid heavy lifting at first, then increase to lifting as tolerated over the next 6 weeks. Do not "push through" the pain.  Listen to your body and avoid positions and maneuvers than reproduce the pain.  Wait a few days before trying something more intense Walking an hour a day is encouraged to help your body recover faster and more safely.  Start slowly and stop when getting sore.  If you can walk 30 minutes without stopping or pain, you can try more intense activity (running, jogging, aerobics, cycling, swimming, treadmill, sex, sports, weightlifting, etc.) Remember: If it hurts to do it, then don't do it! Use Ice and/or Heat You will have swelling and  bruising around the incisions.  This will take several weeks to resolve. Ice packs or heating pads (6-8 times a day, 30-60 minutes at a time) will help sooth soreness & bruising. Some people prefer to use ice alone, heat alone, or alternate between ice & heat.  Experiment and see what works best for you.  Consider trying ice for the first few days to help decrease swelling and bruising; then, switch to heat to help relax sore spots and speed recovery. Shower every day.  Short baths are fine.  It feels good!  Keep the incisions and wounds clean with soap & water.   Try Gentle Massage and/or Stretching Massage at the area of pain many times a day Stop if you feel pain - do not overdo it Take over the counter pain medication This helps the muscle and nerve tissues become less irritable and calm down faster Choose ONE of the following over-the-counter anti-inflammatory medications: Acetaminophen 500mg tabs (Tylenol) 1-2 pills with every meal and just before bedtime (avoid if you have liver problems or if you have   acetaminophen in you narcotic prescription) Naproxen 220mg tabs (ex. Aleve, Naprosyn) 1-2 pills twice a day (avoid if you have kidney, stomach, IBD, or bleeding problems) Ibuprofen 200mg tabs (ex. Advil, Motrin) 3-4 pills with every meal and just before bedtime (avoid if you have kidney, stomach, IBD, or bleeding problems) Take with food/snack several times a day as directed for at least 2 weeks to help keep pain / soreness down & more manageable. Take Narcotic prescription pain medication for more severe pain A prescription for strong pain control is often given to you upon discharge (for example: oxycodone/Percocet, hydrocodone/Norco/Vicodin, or tramadol/Ultram) Take your pain medication as prescribed. Be mindful that most narcotic prescriptions contain Tylenol (acetaminophen) as well - avoid taking too much Tylenol. If you are having problems/concerns with the prescription medicine (does  not control pain, nausea, vomiting, rash, itching, etc.), please call us [phone] to see if we need to switch you to a different pain medicine that will work better for you and/or control your side effects better. If you need a refill on your pain medication, you must call the office before 4 pm and on weekdays only.  By federal law, prescriptions for narcotics cannot be called into a pharmacy.  They must be filled out on paper & picked up from our office by the patient or authorized caretaker.  Prescriptions cannot be filled after 4 pm nor on weekends.    WHEN TO CALL US [phone] Severe uncontrolled or worsening pain  Fever over 101 F (38.5 C) Concerns with the incision: Worsening pain, redness, rash/hives, swelling, bleeding, or drainage Reactions / problems with new medications (itching, rash, hives, nausea, etc.) Nausea and/or vomiting Difficulty urinating Difficulty breathing Worsening fatigue, dizziness, lightheadedness, blurred vision Other concerns If you are not getting better after two weeks or are noticing you are getting worse, contact our office [phone] for further advice.  We may need to adjust your medications, re-evaluate you in the office, send you to the emergency room, or see what other things we can do to help. The clinic staff is available to answer your questions during regular business hours (8:30am-5pm).  Please don't hesitate to call and ask to speak to one of our nurses for clinical concerns.    A surgeon from [facility] is always on call at the hospitals 24 hours/day If you have a medical emergency, go to the nearest emergency room or call 911.  FOLLOW UP in our office One the day of your discharge from the hospital (or the next business weekday), please call [facility] to set up or confirm an appointment to see your surgeon in the office for a follow-up appointment.  Usually it is 2-3 weeks after your surgery.   If you  have skin staples at your incision(s), let the office know so we can set up a time in the office for the nurse to remove them (usually around 10 days after surgery). Make sure that you call for appointments the day of discharge (or the next business weekday) from the hospital to ensure a convenient appointment time. IF YOU HAVE DISABILITY OR FAMILY LEAVE FORMS, BRING THEM TO THE OFFICE FOR PROCESSING.  DO NOT GIVE THEM TO YOUR DOCTOR.  [facility] [address], [address], [place], [place]  [address] ? [phone] - Main [phone] - Toll Free,  [phone] - Fax [email or web address]    GETTING TO GOOD BOWEL HEALTH. It is expected for your digestive tract to   need a few months to get back to normal.  It is common for your bowel movements and stools to be irregular.  You will have occasional bloating and cramping that should eventually fade away.  Until you are eating solid food normally, off all pain medications, and back to regular activities; your bowels will not be normal.   Avoiding constipation The goal: ONE SOFT BOWEL MOVEMENT A DAY!    Drink plenty of fluids.  Choose water first. TAKE A FIBER SUPPLEMENT EVERY DAY THE REST OF YOUR LIFE During your first week back home, gradually add back a fiber supplement every day Experiment which form you can tolerate.   There are many forms such as powders, tablets, wafers, gummies, etc Psyllium bran (Metamucil), methylcellulose (Citrucel), Miralax or Glycolax, Benefiber, Flax Seed.  Adjust the dose week-by-week (1/2 dose/day to 6 doses a day) until you are moving your bowels 1-2 times a day.  Cut back the dose or try a different fiber product if it is giving you problems such as diarrhea or bloating. Sometimes a laxative is needed to help jump-start bowels if constipated until the fiber supplement can help regulate your bowels.  If you are tolerating eating & you are farting, it is okay to try a gentle  laxative such as double dose MiraLax, prune juice, or Milk of Magnesia.  Avoid using laxatives too often. Stool softeners can sometimes help counteract the constipating effects of narcotic pain medicines.  It can also cause diarrhea, so avoid using for too long. If you are still constipated despite taking fiber daily, eating solids, and a few doses of laxatives, call our office. Controlling diarrhea Try drinking liquids and eating bland foods for a few days to avoid stressing your intestines further. Avoid dairy products (especially milk & ice cream) for a short time.  The intestines often can lose the ability to digest lactose when stressed. Avoid foods that cause gassiness or bloating.  Typical foods include beans and other legumes, cabbage, broccoli, and dairy foods.  Avoid greasy, spicy, fast foods.  Every person has some sensitivity to other foods, so listen to your body and avoid those foods that trigger problems for you. Probiotics (such as active yogurt, Align, etc) may help repopulate the intestines and colon with normal bacteria and calm down a sensitive digestive tract Adding a fiber supplement gradually can help thicken stools by absorbing excess fluid and retrain the intestines to act more normally.  Slowly increase the dose over a few weeks.  Too much fiber too soon can backfire and cause cramping & bloating. It is okay to try and slow down diarrhea with a few doses of antidiarrheal medicines.   Bismuth subsalicylate (ex. Kayopectate, Pepto Bismol) for a few doses can help control diarrhea.  Avoid if pregnant.   Loperamide (Imodium) can slow down diarrhea.  Start with one tablet (2mg) first.  Avoid if you are having fevers or severe pain.  ILEOSTOMY PATIENTS WILL HAVE CHRONIC DIARRHEA since their colon is not in use.    Drink plenty of liquids.  You will need to drink even more glasses of water/liquid a day to avoid getting dehydrated. Record output from your ileostomy.  Expect to empty  the bag every 3-4 hours at first.  Most people with a permanent ileostomy empty their bag 4-6 times at the least.   Use antidiarrheal medicine (especially Imodium) several times a day to avoid getting dehydrated.  Start with a dose at bedtime & breakfast.  Adjust up or   down as needed.  Increase antidiarrheal medications as directed to avoid emptying the bag more than 8 times a day (every 3 hours). Work with your wound ostomy nurse to learn care for your ostomy.  See ostomy care instructions. TROUBLESHOOTING IRREGULAR BOWELS 1) Start with a soft & bland diet. No spicy, greasy, or fried foods.  2) Avoid gluten/wheat or dairy products from diet to see if symptoms improve. 3) Miralax 17gm or flax seed mixed in 8oz. water or juice-daily. May use 2-4 times a day as needed. 4) Gas-X, Phazyme, etc. as needed for gas & bloating.  5) Prilosec (omeprazole) over-the-counter as needed 6)  Consider probiotics (Align, Activa, etc) to help calm the bowels down  Call your doctor if you are getting worse or not getting better.  Sometimes further testing (cultures, endoscopy, X-ray studies, CT scans, bloodwork, etc.) may be needed to help diagnose and treat the cause of the diarrhea. [facility] [address], [address], [place], [place]  [address] [phone] - Main.    [phone]  - Toll Free.   [phone] - Fax [email or web address]   ###############################   #######################################################  Ostomy Support Information  You've heard that people get along just fine with only one of their eyes, or one of their lungs, or one of their kidneys. But you also know that you have only one intestine and only one bladder, and that leaves you feeling awfully empty, both physically and emotionally: You think no other people go around without part of their intestine with the ends of their intestines sticking out through their abdominal walls.    YOU ARE NOT ALONE.  There are nearly three quarters of a million people in the [place] who have an ostomy; people who have had surgery to remove all or part of their colons or bladders.   There is even a national association, the [employer] with over 350 local affiliated support groups that are organized by volunteers who provide peer support and counseling. [employer] has a toll free telephone num-ber, 800-826-0826 and an educational, interactive website, www.ostomy.org   An ostomy is an opening in the belly (abdominal wall) made by surgery. Ostomates are people who have had this procedure. The opening (stoma) allows the kidney or bowel to grdischarge waste. An external pouch covers the stoma to collect waste. Pouches are are a simple bag and are odor free. Different companies have disposable or reusable pouches to fit one's lifestyle. An ostomy can either be temporary or permanent.   THERE ARE THREE MAIN TYPES OF OSTOMIES Colostomy. A colostomy is a surgically created opening in the large intestine (colon). Ileostomy. An ileostomy is a surgically created opening in the small intestine. Urostomy. A urostomy is a surgically created opening to divert urine away from the bladder.  OSTOMY Care  The following guidelines will make care of your colostomy easier. Keep this information close by for quick reference.  Helpful DIET hints Eat a well-balanced diet including vegetables and fresh fruits. Eat on a regular schedule.  Drink at least 6 to 8 glasses of fluids daily. Eat slowly in a relaxed atmosphere. Chew your food thoroughly. Avoid chewing gum, smoking, and drinking from a straw. This will help decrease the amount of air you swallow, which may help reduce gas. Eating yogurt or drinking buttermilk may help reduce gas.  To control gas at night, do not eat after 8 p.m. This will give your bowel time to quiet down before you go   to bed.  If gas is a problem, you can purchase  Beano. Sprinkle Beano on the first bite of food before eating to reduce gas. It has no flavor and should not change the taste of your food. You can buy Beano over the counter at your local drugstore.  Foods like fish, onions, garlic, broccoli, asparagus, and cabbage produce odor. Although your pouch is odor-proof, if you eat these foods you may notice a stronger odor when emptying your pouch. If this is a concern, you may want to limit these foods in your diet.  If you have an ileostomy, you will have chronic diarrhea & need to drink more liquids to avoid getting dehydrated.  Consider antidiarrheal medicine like imodium (loperamide) or Lomotil to help slow down bowel movements / diarrhea into your ileostomy bag.  GETTING TO GOOD BOWEL HEALTH WITH AN ILEOSTOMY    With the colon bypassed & not in use, you will have small bowel diarrhea.   It is important to thicken & slow your bowel movements down.   The goal: 4-6 small BOWEL MOVEMENTS A DAY It is important to drink plenty of liquids to avoid getting dehydrated  CONTROLLING ILEOSTOMY DIARRHEA  TAKE A FIBER SUPPLEMENT (FiberCon or Benefiner soluble fiber) twice a day - to thicken stools by absorbing excess fluid and retrain the intestines to act more normally.  Slowly increase the dose over a few weeks.  Too much fiber too soon can backfire and cause cramping & bloating.  TAKE AN IRON SUPPLEMENT twice a day to naturally constipate your bowels.  Usually ferrous sulfate 325mg twice a day)  TAKE ANTI-DIARRHEAL MEDICINES: Loperamide (Imodium) can slow down diarrhea.  Start with two tablets (= 4mg) first and then try one tablet every 6 hours.  Can go up to 2 pills four times day (8 pills of 2mg max) Avoid if you are having fevers or severe pain.  If you are not better or start feeling worse, stop all medicines and call your doctor for advice LoMotil (Diphenoxylate / Atropine) is another medicine that can constipate & slow down bowel moevements Pepto  Bismol (bismuth) can gently thicken bowels as well  If diarrhea is worse,: drink plenty of liquids and try simpler foods for a few days to avoid stressing your intestines further. Avoid dairy products (especially milk & ice cream) for a short time.  The intestines often can lose the ability to digest lactose when stressed. Avoid foods that cause gassiness or bloating.  Typical foods include beans and other legumes, cabbage, broccoli, and dairy foods.  Every person has some sensitivity to other foods, so listen to our body and avoid those foods that trigger problems for you.Call your doctor if you are getting worse or not better.  Sometimes further testing (cultures, endoscopy, X-ray studies, bloodwork, etc) may be needed to help diagnose and treat the cause of the diarrhea. Take extra anti-diarrheal medicines (maximum is 8 pills of 2mg loperamide a day)   Tips for POUCHING an OSTOMY   Changing Your Pouch The best time to change your pouch is in the morning, before eating or drinking anything. Your stoma can function at any time, but it will function more after eating or drinking.   Applying the pouching system  Place all your equipment close at hand before removing your pouch.  Wash your hands.  Stand or sit in front of a mirror. Use the position that works best for you. Remember that you must keep the skin around the stoma   wrinkle-free for a good seal.  Gently remove the used pouch (1-piece system) or the pouch and old wafer (2-piece system). Empty the pouch into the toilet. Save the closure clip to use again.  Wash the stoma itself and the skin around the stoma. Your stoma may bleed a little when being washed. This is normal. Rinse and pat dry. You may use a wash cloth or soft paper towels (like Bounty), mild soap (like Dial, Safeguard, or Ivory), and water. Avoid soaps that contain perfumes or lotions.  For a new pouch (1-piece system) or a new wafer (2-piece system), measure your  stoma using the stoma guide in each box of supplies.  Trace the shape of your stoma onto the back of the new pouch or the back of the new wafer. Cut out the opening. Remove the paper backing and set it aside.  Optional: Apply a skin barrier powder to surrounding skin if it is irritated (bare or weeping), and dust off the excess. Optional: Apply a skin-prep wipe (such as Skin Prep or All-Kare) to the skin around the stoma, and let it dry. Do not apply this solution if the skin is irritated (red, tender, or broken) or if you have shaved around the stoma. Optional: Apply a skin barrier paste (such as Stomahesive, Coloplast, or Premium) around the opening cut in the back of the pouch or wafer. Allow it to dry for 30 to 60 seconds.  Hold the pouch (1-piece system) or wafer (2-piece system) with the sticky side toward your body. Make sure the skin around the stoma is wrinkle-free. Center the opening on the stoma, then press firmly to your abdomen (Fig. 4). Look in the mirror to check if you are placing the pouch, or wafer, in the right position. For a 2-piece system, snap the pouch onto the wafer. Make sure it snaps into place securely.  Place your hand over the stoma and the pouch or wafer for about 30 seconds. The heat from your hand can help the pouch or wafer stick to your skin.  Add deodorant (such as Super Banish or Nullo) to your pouch. Other options include food extracts such as vanilla oil and peppermint extract. Add about 10 drops of the deodorant to the pouch. Then apply the closure clamp. Note: Do not use toxic  chemicals or commercial cleaning agents in your pouch. These substances may harm the stoma.  Optional: For extra seal, apply tape to all 4 sides around the pouch or wafer, as if you were framing a picture. You may use any brand of medical adhesive tape. Change your pouch every 5 to 7 days. Change it immediately if a leak occurs.  Wash your hands afterwards.  If you are wearing a  2-piece system, you may use 2 new pouches per week and alternate them. Rinse the pouch with mild soap and warm water and hang it to dry for the next day. Apply the fresh pouch. Alternate the 2 pouches like this for a week. After a week, change the wafer and begin with 2 new pouches. Place the old pouches in a plastic bag, and put them in the trash.   LIVING WITH AN OSTOMY  Emptying Your Pouch Empty your pouch when it is one-third full (of urine, stool, and/or gas). If you wait until your pouch is fuller than this, it will be more difficult to empty and more noticeable. When you empty your pouch, either put toilet paper in the toilet bowl first, or flush the   toilet while you empty the pouch. This will reduce splashing. You can empty the pouch between your legs or to one side while sitting, or while standing or stooping. If you have a 2-piece system, you can snap off the pouch to empty it. Remember that your stoma may function during this time. If you wish to rinse your pouch after you empty it, a turkey baster can be helpful. When using a baster, squirt water up into the pouch through the opening at the bottom. With a 2-piece system, you can snap off the pouch to rinse it. After rinsing  your pouch, empty it into the toilet. When rinsing your pouch at home, put a few granules of Dreft soap in the rinse water. This helps lubricate and freshen your pouch. The inside of your pouch can be sprayed with non-stick cooking oil (Pam spray). This may help reduce stool sticking to the inside of the pouch.  Bathing You may shower or bathe with your pouch on or off. Remember that your stoma may function during this time.  The materials you use to wash your stoma and the skin around it should be clean, but they do not need to be sterile.  Wearing Your Pouch During hot weather, or if you perspire a lot in general, wear a cover over your pouch. This may prevent a rash on your skin under the pouch. Pouch covers are  sold at ostomy supply stores. Wear the pouch inside your underwear for better support. Watch your weight. Any gain or loss of 10 to 15 pounds or more can change the way your pouch fits.  Going Away From Home A collapsible cup (like those that come in travel kits) or a soft plastic squirt bottle with a pull-up top (like a travel bottle for shampoo) can be used for rinsing your pouch when you are away from home. Tilt the opening of the pouch at an upward angle when using a cup to rinse.  Carry wet wipes or extra tissues to use in public bathrooms.  Carry an extra pouching system with you at all times.  Never keep ostomy supplies in the glove compartment of your car. Extreme heat or cold can damage the skin barriers and adhesive wafers on the pouch.  When you travel, carry your ostomy supplies with you at all times. Keep them within easy reach. Do not pack ostomy supplies in baggage that will be checked or otherwise separated from you, because your baggage might be lost. If you're traveling out of the country, it is helpful to have a letter stating that you are carrying ostomy supplies as a medical necessity.  If you need ostomy supplies while traveling, look in the yellow pages of the telephone book under "Surgical Supplies." Or call the local ostomy organization to find out where supplies are available.  Do not let your ostomy supplies get low. Always order new pouches before you use the last one.  Reducing Odor Limit foods such as broccoli, cabbage, onions, fish, and garlic in your diet to help reduce odor. Each time you empty your pouch, carefully clean the opening of the pouch, both inside and outside, with toilet paper. Rinse your pouch 1 or 2 times daily after you empty it (see directions for emptying your pouch and going away from home). Add deodorant (such as Super Banish or Nullo) to your pouch. Use air deodorizers in your bathroom. Do not add aspirin to your pouch. Even though  aspirin can help prevent odor, it   could cause ulcers on your stoma.  When to call the doctor Call the doctor if you have any of the following symptoms: Purple, black, or white stoma Severe cramps lasting more than 6 hours Severe watery discharge from the stoma lasting more than 6 hours No output from the colostomy for 3 days Excessive bleeding from your stoma Swelling of your stoma to more than 1/2-inch larger than usual Pulling inward of your stoma below skin level Severe skin irritation or deep ulcers Bulging or other changes in your abdomen  When to call your ostomy nurse Call your ostomy/enterostomal therapy (WOCN) nurse if any of the following occurs: Frequent leaking of your pouching system Change in size or appearance of your stoma, causing discomfort or problems with your pouch Skin rash or rawness Weight gain or loss that causes problems with your pouch     FREQUENTLY ASKED QUESTIONS   Why haven't you met any of these folks who have an ostomy?  Well, maybe you have! You just did not recognize them because an ostomy doesn't show. It can be kept secret if you wish. Why, maybe some of your best friends, office associates or neighbors have an ostomy ... you never can tell. People facing ostomy surgery have many quality-of-life questions like: Will you bulge? Smell? Make noises? Will you feel waste leaving your body? Will you be a captive of the toilet? Will you starve? Be a social outcast? Get/stay married? Have babies? Easily bathe, go swimming, bend over?  OK, let's look at what you can expect:   Will you bulge?  Remember, without part of the intestine or bladder, and its contents, you should have a flatter tummy than before. You can expect to wear, with little exception, what you wore before surgery ... and this in-cludes tight clothing and bathing suits.   Will you smell?  Today, thanks to modern odor proof pouching systems, you can walk into an ostomy support group  meeting and not smell anything that is foul or offensive. And, for those with an ileostomy or colostomy who are concerned about odor when emptying their pouch, there are in-pouch deodorants that can be used to eliminate any waste odors that may exist.   Will you make noises?  Everyone produces gas, especially if they are an air-swallower. But intestinal sounds that occur from time to time are no differ-ent than a gurgling tummy, and quite often your clothing will muffle any sounds.   Will you feel the waste discharges?  For those with a colostomy or ileostomy there might be a slight pressure when waste leaves your body, but understand that the intestines have no nerve endings, so there will be no unpleasant sensations. Those with a urostomy will probably be unaware of any kidney drainage.   Will you be a captive of the toilet?  Immediately post-op you will spend more time in the bathroom than you will after your body recovers from surgery. Every person is different, but on average those with an ileostomy or urostomy may empty their pouches 4 to 6 times a day; a little  less if you have a colostomy. The average wear time between pouch system changes is 3 to 5 days and the changing process should take less than 30 minutes.   Will I need to be on a special diet? Most people return to their normal diet when they have recovered from surgery. Be sure to chew your food well, eat a well-balanced diet and drink plenty of fluids. If   you experience problems with a certain food, wait a couple of weeks and try it again.  Will there be odor and noises? Pouching systems are designed to be odor-proof or odor-resistant. There are deodorants that can be used in the pouch. Medications are also available to help reduce odor. Limit gas-producing foods and carbonated beverages. You will experience less gas and fewer noises as you heal from surgery.  How much time will it take to care for my ostomy? At first, you may  spend a lot of time learning about your ostomy and how to take care of it. As you become more comfortable and skilled at changing the pouching system, it will take very little time to care for it.   Will I be able to return to work? People with ostomies can perform most jobs. As soon as you have healed from surgery, you should be able to return to work. Heavy lifting (more than 10 pounds) may be discouraged.   What about intimacy? Sexual relationships and intimacy are important and fulfilling aspects of your life. They should continue after ostomy surgery. Intimacy-related concerns should be discussed openly between you and your partner.   Can I wear regular clothing? You do not need to wear special clothing. Ostomy pouches are fairly flat and barely noticeable. Elastic undergarments will not hurt the stoma or prevent the ostomy from functioning.   Can I participate in sports? An ostomy should not limit your involvement in sports. Many people with ostomies are runners, skiers, swimmers or participate in other active lifestyles. Talk with your caregiver first before doing heavy physical activity.  Will you starve?  Not if you follow doctor's orders at each stage of your post-op adjustment. There is no such thing as an "ostomy diet". Some people with an ostomy will be able to eat and tolerate anything; others may find diffi-culty with some foods. Each person is an individual and must determine, by trial, what is best for them. A good practice for all is to drink plenty of water.   Will you be a social outcast?  Have you met anyone who has an ostomy and is a social outcast? Why should you be the first? Only your attitude and self image will effect how you are treated. No confi-dent person is an outcast.    PROFESSIONAL HELP   Resources are available if you need help or have questions about your ostomy.   Specially trained nurses called Wound, Ostomy Continence Nurses (WOCN) are available for  consultation in most major medical centers.  Consider getting an ostomy consult at an outpatient ostomy clinic.   [place] has an Ostomy Clinic run by an WOCN ostomy nurse at the [facility].  [phone]. [facility] can help set up an appointment   The [employer] Ost[employer] Association (UOA) is a group made up of many local chapters throughout the [place]. These local groups hold meetings and provide support to prospective and existing ostomates. They sponsor educational events and have qualified visitors to make personal or telephone visits. Contact the UOA for the chapter nearest you and for other educational publications.  More detailed information can be found in Colostomy Guide, a publication of the [employer] Ostomy Association (UOA). Contact UOA at 1-800-826-0826 or visit their web site at www.uoaa.org. The website contains links to other sites, suppliers and resources.  [facility] Secure Start Services: Start at the website to enlist for support.  Your Wound Ostomy (WOCN) nurse may have started this   process. https://www.[employer].com/en/securestart Secure Start services are designed to support people as they live their lives with an ostomy or neurogenic bladder. Enrolling is easy and at no cost to the patient. We realize that each person's needs and life journey are different. Through Secure Start services, we want to help people live their life, their way.  #######################################################  

## 2021-06-10 NOTE — Progress Notes (Addendum)
Physical Therapy Treatment Patient Details Name: Tina Price MRN: [id number] DOB: 10-19-1940 Today's Date: 06/10/2021    History of Present Illness Pt s/p colectomy 2* colon CA.  Pt with hx of chronic back pain, chronic UTI, lumbar fusion and dysphonia.    PT Comments    Patient making excellent progress with acute PT and ambulated ~350' with RW and min guard/supervision. Cues at start for safe management of RW and for "heel-toe" pattern to improve hip flexion and foot clearance. Patient completed stair training in preparation for safe return home, no LOB noted and pt verbalized understanding for family to assist. Overall pt is progressing well and mobilizing at min guard/supervision level. Encouraged pt to continue mobilizing with RN/NT staff. She is mobilizing at safe level to return home with assist from family. Acute PT will follow and progress as able.    Follow Up Recommendations  No PT follow up     Equipment Recommendations  None recommended by PT    Recommendations for Other Services       Precautions / Restrictions Precautions Precautions: Fall Precaution Comments: s/p abdominal surgery Restrictions Weight Bearing Restrictions: No    Mobility  Bed Mobility               General bed mobility comments: pt OOB in bathroom with NT on arrival    Transfers Overall transfer level: Needs assistance Equipment used: Rolling walker (2 wheeled) Transfers: Sit to/from Stand Sit to Stand: Supervision         General transfer comment: pt needs extra time and UE's for power up from toilet and recliner.  Ambulation/Gait Ambulation/Gait assistance: Min guard;Supervision Gait Distance (Feet): 350 Feet Assistive device: Rolling walker (2 wheeled) Gait Pattern/deviations: Step-through pattern;Decreased step length - right;Decreased step length - left;Shuffle;Trunk flexed;Decreased stride length Gait velocity: decreased   General Gait Details: overall pt  with steady cautious gait, no overt LOB. cues for posture throughout and to maintain safe position to RW. pt with low hip/knee flexion and cues for "heel to toe" pattern improved foot clearnace throughout gait.   Stairs Stairs: Yes Stairs assistance: Min guard Stair Management: Two rails;Step to pattern;Forwards Number of Stairs: 3 General stair comments: cues for step sequence to lead with strong up and lead with weak down. no overt LOB and pt verablized safe guarding for family to provide.   Wheelchair Mobility    Modified Rankin (Stroke Patients Only)       Balance Overall balance assessment: Needs assistance Sitting-balance support: Feet supported;No upper extremity supported Sitting balance-Leahy Scale: Fair     Standing balance support: Bilateral upper extremity supported Standing balance-Leahy Scale: Poor                              Cognition Arousal/Alertness: Awake/alert Behavior During Therapy: WFL for tasks assessed/performed Overall Cognitive Status: Within Functional Limits for tasks assessed                                        Exercises      General Comments        Pertinent Vitals/Pain Pain Assessment: 0-10 Pain Score: 0-No pain Pain Location: abdomen Pain Descriptors / Indicators: Sore Pain Intervention(s): Limited activity within patient's tolerance;Monitored during session    Home Living  Prior Function            PT Goals (current goals can now be found in the care plan section) Acute Rehab PT Goals Patient Stated Goal: Regain IND PT Goal Formulation: With patient Time For Goal Achievement: 06/21/21 Potential to Achieve Goals: Good Progress towards PT goals: Progressing toward goals    Frequency    Min 3X/week      PT Plan Current plan remains appropriate    Co-evaluation              AM-PAC PT "6 Clicks" Mobility   Outcome Measure  Help needed turning  from your back to your side while in a flat bed without using bedrails?: A Little Help needed moving from lying on your back to sitting on the side of a flat bed without using bedrails?: A Little Help needed moving to and from a bed to a chair (including a wheelchair)?: A Little Help needed standing up from a chair using your arms (e.g., wheelchair or bedside chair)?: A Little Help needed to walk in hospital room?: A Little Help needed climbing 3-5 steps with a railing? : A Little 6 Click Score: 18    End of Session Equipment Utilized During Treatment: Gait belt Activity Tolerance: Patient tolerated treatment well Patient left: in bed;with call bell/phone within reach;with bed alarm set Nurse Communication: Mobility status PT Visit Diagnosis: Difficulty in walking, not elsewhere classified (R26.2);Pain Pain - part of body:  (abdomen)     Time: 0942 (934 (pt on toilet, notbillable until 942))-1006 PT Time Calculation (min) (ACUTE ONLY): 24 min  Charges:  $Gait Training: 23-37 mins                     Verner Mould, DPT Acute Rehabilitation Services Office [phone] Pager [phone]    Jacques Navy 06/10/2021, 10:15 AM

## 2021-06-10 NOTE — Anesthesia Postprocedure Evaluation (Signed)
Anesthesia Post Note  Patient: Tina Price  Procedure(s) Performed: ROBOTIC RIGHTCOLECTOMY (Abdomen)     Patient location during evaluation: PACU Anesthesia Type: General Level of consciousness: awake and alert Pain management: pain level controlled Vital Signs Assessment: post-procedure vital signs reviewed and stable Respiratory status: spontaneous breathing, nonlabored ventilation, respiratory function stable and patient connected to nasal cannula oxygen Cardiovascular status: blood pressure returned to baseline and stable Postop Assessment: no apparent nausea or vomiting Anesthetic complications: no   No notable events documented.  Last Vitals:  Vitals:   06/09/21 2022 06/10/21 0555  BP: (!) 145/71 135/62  Pulse: 66 60  Resp: 15 16  Temp: 36.8 C 36.5 C  SpO2: 97% 97%    Last Pain:  Vitals:   06/10/21 0555  TempSrc: Oral  PainSc:    Pain Goal: Patients Stated Pain Goal: 2 (06/09/21 0307)                 Merlinda Frederick

## 2021-06-10 NOTE — Progress Notes (Signed)
Patient was given discharge instructions, and all questions were answered.  Patient was stable for discharge and was taken to the main exit by wheelchair. 

## 2021-06-10 NOTE — Discharge Summary (Signed)
Physician Discharge Summary  Patient ID: Tina Price MRN: [id number] DOB/AGE: 1941/07/01 80 y.o.  Admit date: 06/07/2021 Discharge date: 06/10/2021  Admission Diagnoses:  Discharge Diagnoses:  Active Problems:   Colon cancer [facility]   Discharged Condition: good  Hospital Course: Patient admitted to the floor after robotic assisted right colectomy.  Over the following 2 days her diet was advanced and her Foley was removed.  By postop day 3 she was tolerating a diet, ambulating without difficulty, urinating well and having bowel function.  She was felt to be in stable condition for discharge to home.  Consults: None  Significant Diagnostic Studies: labs: cbc, bmet  Treatments: IV hydration, analgesia: Tramadol, and surgery: Robotic R colectomy  Discharge Exam: Blood pressure 135/62, pulse 60, temperature 97.7 F (36.5 C), temperature source Oral, resp. rate 16, height 5' (1.524 m), weight 75 kg, SpO2 97 %. General appearance: alert and cooperative GI: normal findings: soft, non-tender Incision/Wound: clean, dry, intact  Disposition: Discharge disposition: 01-Home or Self Care        Allergies as of 06/10/2021       Reactions   Doxycycline Other (See Comments)   Knocks her out   Penicillins Rash   Reaction: was in her 20's.  Tolerates Cephalosporins     Sulfa Antibiotics Rash   Blistering in your mouth        Medication List     TAKE these medications    AZO-CRANBERRY PO Take 3-4 tablets by mouth daily.   buPROPion 150 MG 24 hr tablet Commonly known as: WELLBUTRIN XL Take 150 mg by mouth every morning.   diazepam 5 MG tablet Commonly known as: VALIUM Take one tablet by mouth three times a day as needed What changed: additional instructions   estradiol 0.1 MG/GM vaginal cream Commonly known as: ESTRACE Place 1 Applicatorful vaginally every other day.   hydrochlorothiazide 12.5 MG capsule Commonly known as: MICROZIDE Take 12.5 mg by mouth  daily.   HYDROcodone-acetaminophen 5-325 MG tablet Commonly known as: NORCO/VICODIN Take 1 tablet by mouth every 8 (eight) hours as needed for moderate pain.   methenamine 1 g tablet Commonly known as: HIPREX Take 1 g by mouth 2 (two) times daily.   multivitamin with minerals tablet Take 1 tablet by mouth daily.   Myrbetriq 50 MG Tb24 tablet Generic drug: mirabegron ER Take 50 mg by mouth daily.   nitrofurantoin 100 MG capsule Commonly known as: MACRODANTIN Take 100 mg by mouth at bedtime.   ondansetron 8 MG disintegrating tablet Commonly known as: ZOFRAN-ODT Take 8 mg by mouth every 8 (eight) hours as needed for nausea or vomiting.   sertraline 50 MG tablet Commonly known as: ZOLOFT Take 50 mg by mouth daily.   simvastatin 20 MG tablet Commonly known as: ZOCOR Take 20 mg by mouth at bedtime.   topiramate 25 MG tablet Commonly known as: TOPAMAX Take 50 mg by mouth at bedtime as needed (vertigo).   traMADol 50 MG tablet Commonly known as: ULTRAM Take 1-2 tablets (50-100 mg total) by mouth every 6 (six) hours as needed for moderate pain.   verapamil 240 MG CR tablet Commonly known as: CALAN-SR Take 240 mg by mouth at bedtime.        Follow-up Information     Leighton Ruff, MD. Schedule an appointment as soon as possible for a visit in 2 week(s).   Specialties: General Surgery, Colon and Rectal Surgery Contact information: [address] [place] [address] [phone]  Signed: Rosario Adie [phone], 8:39 AM

## 2021-06-13 HISTORY — PX: COLECTOMY: SHX59

## 2021-06-14 LAB — SURGICAL PATHOLOGY

## 2021-06-19 ENCOUNTER — Other Ambulatory Visit: Payer: Self-pay

## 2021-06-19 NOTE — Progress Notes (Signed)
The proposed treatment discussed in conference is for discussion purpose only and is not a binding recommendation.  The patients have not been physically examined, or presented with their treatment options.  Therefore, final treatment plans cannot be decided.  

## 2021-06-20 ENCOUNTER — Encounter: Payer: Self-pay | Admitting: *Deleted

## 2021-06-20 NOTE — Progress Notes (Signed)
Request for Pathology to add on testing for BRAF mutation and MLH1 methylation testing sent.

## 2021-06-21 ENCOUNTER — Other Ambulatory Visit: Payer: Self-pay

## 2021-06-21 ENCOUNTER — Inpatient Hospital Stay: Payer: Medicare PPO | Attending: Oncology | Admitting: Oncology

## 2021-06-21 VITALS — BP 130/58 | HR 75 | Temp 98.1°F | Resp 18 | Ht 60.0 in | Wt 157.8 lb

## 2021-06-21 DIAGNOSIS — Z808 Family history of malignant neoplasm of other organs or systems: Secondary | ICD-10-CM | POA: Diagnosis not present

## 2021-06-21 DIAGNOSIS — R490 Dysphonia: Secondary | ICD-10-CM

## 2021-06-21 DIAGNOSIS — I10 Essential (primary) hypertension: Secondary | ICD-10-CM | POA: Diagnosis not present

## 2021-06-21 DIAGNOSIS — C184 Malignant neoplasm of transverse colon: Secondary | ICD-10-CM | POA: Diagnosis not present

## 2021-06-21 DIAGNOSIS — K635 Polyp of colon: Secondary | ICD-10-CM

## 2021-06-21 NOTE — Progress Notes (Addendum)
[facility] New Patient Consult   Requesting MD: Tina Price Floro, Md [address] [place],  [place] [address]   Tina Price Price 80 y.o.  [birth year]    Reason for Consult: Colon cancer   HPI: Tina Price Price when a screening colonoscopy by Tina Price Price on 03/26/2021.  Polyps were removed from the descending and transverse colon.  A fungating infiltrating partially obstructing mass was found at 5 cm from the anus.  The mass could not be passed.  The mass was biopsied and the area was tattooed.  The pathology revealed invasive moderately differentiated adenocarcinoma involving the colon mass.  The polyps returned as tubular adenomas.  CTs 8-22 revealed small scattered nodules at the lung bases.  A circumferential apple core area of wall thickening was noted in the proximal transverse colon near the hepatic flexure.  No enlarged abdominal pelvic lymph nodes.  No evidence of metastatic disease.  The lung nodules are felt to most likely be benign.  She was referred to Tina Price Price and was taken to the operating room on 06/07/2021 for a robotic right colectomy.  A mass was noted in the proximal transverse colon.  No evidence of metastatic disease.  She reports an uneventful operative recovery.  Her bowels are functioning.  Past Medical History:  Diagnosis Date   Arthritis    Back pain, chronic    Cancer (HCC)    skin   Chronic UTI    Headache-migraine    Hypertension     .  Dysphonia   .  G2, P2   Past Surgical History:  Procedure Laterality Date   DILATION AND CURETTAGE OF UTERUS     LUMBAR FUSION     SHOULDER ARTHROSCOPY WITH OPEN ROTATOR CUFF REPAIR      Medications: Reviewed  Allergies:  Allergies  Allergen Reactions   Doxycycline Other (See Comments)    Knocks her out   Penicillins Rash    Reaction: was in her 20's.  Tolerates Cephalosporins     Sulfa Antibiotics Rash    Blistering in your mouth    Family history: Her mother had colon cancer  at age 53.  A sister died of metastatic melanoma involving the brain.   Social History:   She lives alone in [place].  She is retired Runner, broadcasting/film/video.  She does not use cigarettes.  Rare alcohol use.  No transfusion history.  No risk factor for HIV or hepatitis.  She has received COVID-19 vaccines.  ROS:   Positives include: Decreased stool caliber for a few months prior to surgery, chronic neck and low back pain  A complete ROS was otherwise negative.  Physical Exam:  Blood pressure (!) 130/58, pulse 75, temperature 98.1 F (36.7 C), temperature source Oral, resp. rate 18, height 5' (1.524 m), weight 157 lb 12.8 oz (71.6 kg), SpO2 97 %.  HEENT: Neck without mass Lungs: Clear bilaterally Cardiac: Regular rate and rhythm Abdomen: No mass, no hepatosplenomegaly, nontender, trocar sites with glue, low transverse incision with superficial opening  Vascular: The left lower leg is slightly larger than the right side, no edema Lymph nodes: No cervical, supraclavicular, axillary, or inguinal nodes Neurologic: Alert and oriented, the motor exam appears intact in the upper and lower extremities bilaterally Skin: No rash Musculoskeletal: No spine tenderness   LAB:  CBC  Lab Results  Component Value Date   WBC 5.5 06/10/2021   HGB 10.4 (L) 06/10/2021   HCT 32.7 (L) 06/10/2021   MCV 96.5 06/10/2021  PLT 228 06/10/2021        CMP  Lab Results  Component Value Date   NA 139 06/10/2021   K 3.6 06/10/2021   CL 103 06/10/2021   CO2 27 06/10/2021   GLUCOSE 97 06/10/2021   BUN 10 06/10/2021   CREATININE 0.87 06/10/2021   CALCIUM 8.9 06/10/2021   GFRNONAA >60 06/10/2021     Lab Results  Component Value Date   CEA1 2.1 05/23/2021    Imaging: CT images from 05/14/2021 reviewed    Assessment/Plan:   Transverse colon cancer, stage I, 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-4 cm and 1.3 cm tumors, moderately differentiated, 1 cm apart, larger tumor invades into deep muscularis, smaller tumor  involves a polyp and invades the superficial muscularis 0/21 lymph nodes, no lymphovascular or perineural invasion, no macroscopic tumor perforation, no tumor deposits, negative resection margins,pT2pN0, MSI-high, loss of MLH1 and PMS2 expression,BRAF V600E mutation, MLH1 hypermethylation present Colonoscopy 03/26/2021-multiple transverse and descending polyps, mass in the transverse colon-tubular adenomas and adenocarcinoma CTs 05/14/2021-circumferential thickening in the proximal transverse colon, no evidence of metastatic disease, numerous small pulmonary nodules-likely benign Multiple polyps on the colonoscopy 03/26/2021 Dysphonia Hypertension Family history of colon cancer and melanoma   Disposition:   Tina Price Price has been diagnosed with adenocarcinoma of the transverse colon.  There is a synchronous adenocarcinoma in an adjacent polyp.  She had multiple additional adenomatous polyps on the colonoscopy in June.  I reviewed details of the surgical pathology report with Tina Price Price and her son.  She has stage I colon cancer.  She has a good prognosis for a long-term disease-free survival.  There is no indication for adjuvant systemic therapy.  The tumor has loss of MLH1 and PMS2 expression, most commonly seen in sporadic colon cancers.  The BRAF mutation also argues against hereditary nonpolyposis colon cancer syndrome.  However, her family members are at increased risk of developing colorectal cancer and should receive appropriate screening.  She will follow-up with Tina Price Price for colonoscopy surveillance.  She is not scheduled for a follow-up appointment at the Cancer center.  She will continue clinical follow-up with Tina Price Price and Tina Price Price.  I am available to see her in the future as needed.  The tiny lung nodules are very likely a benign finding.    Tina Price Coder, MD  06/21/2021, 3:25 PM

## 2021-07-15 DIAGNOSIS — N3001 Acute cystitis with hematuria: Secondary | ICD-10-CM | POA: Diagnosis not present

## 2021-08-01 DIAGNOSIS — G8918 Other acute postprocedural pain: Secondary | ICD-10-CM | POA: Diagnosis not present

## 2021-08-02 DIAGNOSIS — R309 Painful micturition, unspecified: Secondary | ICD-10-CM | POA: Diagnosis not present

## 2021-08-03 DIAGNOSIS — N3001 Acute cystitis with hematuria: Secondary | ICD-10-CM | POA: Diagnosis not present

## 2021-08-14 DIAGNOSIS — N3000 Acute cystitis without hematuria: Secondary | ICD-10-CM | POA: Diagnosis not present

## 2021-08-16 DIAGNOSIS — N3020 Other chronic cystitis without hematuria: Secondary | ICD-10-CM | POA: Diagnosis not present

## 2021-08-23 DIAGNOSIS — J385 Laryngeal spasm: Secondary | ICD-10-CM | POA: Diagnosis not present

## 2021-09-16 DIAGNOSIS — M7740 Metatarsalgia, unspecified foot: Secondary | ICD-10-CM | POA: Diagnosis not present

## 2021-09-16 DIAGNOSIS — L84 Corns and callosities: Secondary | ICD-10-CM | POA: Diagnosis not present

## 2021-09-16 DIAGNOSIS — R42 Dizziness and giddiness: Secondary | ICD-10-CM | POA: Diagnosis not present

## 2021-09-16 DIAGNOSIS — D3610 Benign neoplasm of peripheral nerves and autonomic nervous system, unspecified: Secondary | ICD-10-CM | POA: Diagnosis not present

## 2021-09-16 DIAGNOSIS — L729 Follicular cyst of the skin and subcutaneous tissue, unspecified: Secondary | ICD-10-CM | POA: Diagnosis not present

## 2021-09-16 DIAGNOSIS — T148XXA Other injury of unspecified body region, initial encounter: Secondary | ICD-10-CM | POA: Diagnosis not present

## 2021-09-23 ENCOUNTER — Ambulatory Visit (INDEPENDENT_AMBULATORY_CARE_PROVIDER_SITE_OTHER): Payer: Medicare PPO

## 2021-09-23 ENCOUNTER — Ambulatory Visit: Payer: Medicare PPO | Admitting: Podiatry

## 2021-09-23 ENCOUNTER — Other Ambulatory Visit: Payer: Self-pay

## 2021-09-23 DIAGNOSIS — M67471 Ganglion, right ankle and foot: Secondary | ICD-10-CM

## 2021-09-23 NOTE — Progress Notes (Signed)
   HPI: 80 y.o. female presenting today as an established patient for new complaint regarding a lesion/bump to the dorsal aspect of the right foot this been present for the last few weeks.  She states that it started small but it slowly increased in size.  She was referred here for follow-up treatment and evaluation.  She says it is minimally symptomatic.  It does rub in her shoes.  Past Medical History:  Diagnosis Date   Arthritis    Back pain, chronic    Cancer ([place])    skin   Chronic UTI    Headache    Hypertension      Physical Exam: General: The patient is alert and oriented x3 in no acute distress.  Dermatology: Skin is warm, dry and supple bilateral lower extremities. Negative for open lesions or macerations.  Vascular: Palpable pedal pulses bilaterally. No edema or erythema noted. Capillary refill within normal limits.  Neurological: Epicritic and protective threshold grossly intact bilaterally.   Musculoskeletal Exam: Fluctuant lesion noted to the dorsum of the right foot about 2-3 cm in diameter consistent with findings of a ganglion cyst  Assessment: 1.  Ganglion cyst right foot   Plan of Care:  1. Patient evaluated. 2.  Pressure was applied to the ganglion cyst and the cyst was ruptured and resorbed into the soft tissues of the foot 3.  Explained to the patient that there is a chance that the ganglion cyst may return.  Recommend daily massage and ice to the area 4.  Return to clinic as needed      Edrick Kins, DPM [facility]  Dr. Edrick Kins, DPM    [address], [place] [address]                Office [phone]  Fax [phone]

## 2021-10-22 DIAGNOSIS — M1711 Unilateral primary osteoarthritis, right knee: Secondary | ICD-10-CM | POA: Diagnosis not present

## 2021-10-24 ENCOUNTER — Other Ambulatory Visit: Payer: Self-pay | Admitting: Family Medicine

## 2021-10-24 DIAGNOSIS — Z1231 Encounter for screening mammogram for malignant neoplasm of breast: Secondary | ICD-10-CM

## 2021-11-07 DIAGNOSIS — M48061 Spinal stenosis, lumbar region without neurogenic claudication: Secondary | ICD-10-CM | POA: Diagnosis not present

## 2021-11-07 DIAGNOSIS — M47812 Spondylosis without myelopathy or radiculopathy, cervical region: Secondary | ICD-10-CM | POA: Diagnosis not present

## 2021-11-07 DIAGNOSIS — R5383 Other fatigue: Secondary | ICD-10-CM | POA: Diagnosis not present

## 2021-11-07 DIAGNOSIS — M7552 Bursitis of left shoulder: Secondary | ICD-10-CM | POA: Diagnosis not present

## 2021-11-13 DIAGNOSIS — M4125 Other idiopathic scoliosis, thoracolumbar region: Secondary | ICD-10-CM | POA: Diagnosis not present

## 2021-11-13 DIAGNOSIS — R2689 Other abnormalities of gait and mobility: Secondary | ICD-10-CM | POA: Diagnosis not present

## 2021-11-14 ENCOUNTER — Ambulatory Visit
Admission: RE | Admit: 2021-11-14 | Discharge: 2021-11-14 | Disposition: A | Discharge status: Home / Self Care | Payer: Medicare PPO | Source: Ambulatory Visit | Attending: Family Medicine | Admitting: Family Medicine

## 2021-11-14 ENCOUNTER — Other Ambulatory Visit: Payer: Self-pay

## 2021-11-14 DIAGNOSIS — Z1231 Encounter for screening mammogram for malignant neoplasm of breast: Secondary | ICD-10-CM

## 2021-11-19 DIAGNOSIS — R2689 Other abnormalities of gait and mobility: Secondary | ICD-10-CM | POA: Diagnosis not present

## 2021-11-19 DIAGNOSIS — M4125 Other idiopathic scoliosis, thoracolumbar region: Secondary | ICD-10-CM | POA: Diagnosis not present

## 2021-11-21 DIAGNOSIS — R2689 Other abnormalities of gait and mobility: Secondary | ICD-10-CM | POA: Diagnosis not present

## 2021-11-21 DIAGNOSIS — M4125 Other idiopathic scoliosis, thoracolumbar region: Secondary | ICD-10-CM | POA: Diagnosis not present

## 2021-11-27 DIAGNOSIS — R2689 Other abnormalities of gait and mobility: Secondary | ICD-10-CM | POA: Diagnosis not present

## 2021-11-27 DIAGNOSIS — M4125 Other idiopathic scoliosis, thoracolumbar region: Secondary | ICD-10-CM | POA: Diagnosis not present

## 2021-12-03 DIAGNOSIS — M4125 Other idiopathic scoliosis, thoracolumbar region: Secondary | ICD-10-CM | POA: Diagnosis not present

## 2021-12-03 DIAGNOSIS — R2689 Other abnormalities of gait and mobility: Secondary | ICD-10-CM | POA: Diagnosis not present

## 2021-12-06 DIAGNOSIS — R2689 Other abnormalities of gait and mobility: Secondary | ICD-10-CM | POA: Diagnosis not present

## 2021-12-06 DIAGNOSIS — M4125 Other idiopathic scoliosis, thoracolumbar region: Secondary | ICD-10-CM | POA: Diagnosis not present

## 2021-12-11 DIAGNOSIS — M4125 Other idiopathic scoliosis, thoracolumbar region: Secondary | ICD-10-CM | POA: Diagnosis not present

## 2021-12-11 DIAGNOSIS — R2689 Other abnormalities of gait and mobility: Secondary | ICD-10-CM | POA: Diagnosis not present

## 2022-01-02 DIAGNOSIS — M48061 Spinal stenosis, lumbar region without neurogenic claudication: Secondary | ICD-10-CM | POA: Diagnosis not present

## 2022-01-02 DIAGNOSIS — M47812 Spondylosis without myelopathy or radiculopathy, cervical region: Secondary | ICD-10-CM | POA: Diagnosis not present

## 2022-01-10 DIAGNOSIS — M19012 Primary osteoarthritis, left shoulder: Secondary | ICD-10-CM | POA: Diagnosis not present

## 2022-01-10 DIAGNOSIS — M25512 Pain in left shoulder: Secondary | ICD-10-CM | POA: Diagnosis not present

## 2022-01-10 DIAGNOSIS — M542 Cervicalgia: Secondary | ICD-10-CM | POA: Diagnosis not present

## 2022-01-15 DIAGNOSIS — M47812 Spondylosis without myelopathy or radiculopathy, cervical region: Secondary | ICD-10-CM | POA: Diagnosis not present

## 2022-01-16 DIAGNOSIS — N3020 Other chronic cystitis without hematuria: Secondary | ICD-10-CM | POA: Diagnosis not present

## 2022-02-07 DIAGNOSIS — J385 Laryngeal spasm: Secondary | ICD-10-CM | POA: Diagnosis not present

## 2022-02-07 DIAGNOSIS — G249 Dystonia, unspecified: Secondary | ICD-10-CM | POA: Diagnosis not present

## 2022-02-10 DIAGNOSIS — H903 Sensorineural hearing loss, bilateral: Secondary | ICD-10-CM | POA: Diagnosis not present

## 2022-02-17 DIAGNOSIS — M48061 Spinal stenosis, lumbar region without neurogenic claudication: Secondary | ICD-10-CM | POA: Diagnosis not present

## 2022-02-17 DIAGNOSIS — M47812 Spondylosis without myelopathy or radiculopathy, cervical region: Secondary | ICD-10-CM | POA: Diagnosis not present

## 2022-02-17 DIAGNOSIS — M5412 Radiculopathy, cervical region: Secondary | ICD-10-CM | POA: Diagnosis not present

## 2022-02-25 DIAGNOSIS — N3000 Acute cystitis without hematuria: Secondary | ICD-10-CM | POA: Diagnosis not present

## 2022-02-25 DIAGNOSIS — R8271 Bacteriuria: Secondary | ICD-10-CM | POA: Diagnosis not present

## 2022-02-26 DIAGNOSIS — M1711 Unilateral primary osteoarthritis, right knee: Secondary | ICD-10-CM | POA: Diagnosis not present

## 2022-03-05 DIAGNOSIS — K5730 Diverticulosis of large intestine without perforation or abscess without bleeding: Secondary | ICD-10-CM | POA: Diagnosis not present

## 2022-03-05 DIAGNOSIS — K648 Other hemorrhoids: Secondary | ICD-10-CM | POA: Diagnosis not present

## 2022-03-05 DIAGNOSIS — Z08 Encounter for follow-up examination after completed treatment for malignant neoplasm: Secondary | ICD-10-CM | POA: Diagnosis not present

## 2022-03-05 DIAGNOSIS — D123 Benign neoplasm of transverse colon: Secondary | ICD-10-CM | POA: Diagnosis not present

## 2022-03-05 DIAGNOSIS — D124 Benign neoplasm of descending colon: Secondary | ICD-10-CM | POA: Diagnosis not present

## 2022-03-05 DIAGNOSIS — Z85038 Personal history of other malignant neoplasm of large intestine: Secondary | ICD-10-CM | POA: Diagnosis not present

## 2022-03-05 DIAGNOSIS — Z980 Intestinal bypass and anastomosis status: Secondary | ICD-10-CM | POA: Diagnosis not present

## 2022-03-06 DIAGNOSIS — Z980 Intestinal bypass and anastomosis status: Secondary | ICD-10-CM | POA: Diagnosis not present

## 2022-03-06 DIAGNOSIS — D124 Benign neoplasm of descending colon: Secondary | ICD-10-CM | POA: Diagnosis not present

## 2022-03-06 DIAGNOSIS — Z08 Encounter for follow-up examination after completed treatment for malignant neoplasm: Secondary | ICD-10-CM | POA: Diagnosis not present

## 2022-03-06 DIAGNOSIS — K648 Other hemorrhoids: Secondary | ICD-10-CM | POA: Diagnosis not present

## 2022-03-06 DIAGNOSIS — K5730 Diverticulosis of large intestine without perforation or abscess without bleeding: Secondary | ICD-10-CM | POA: Diagnosis not present

## 2022-03-06 DIAGNOSIS — D123 Benign neoplasm of transverse colon: Secondary | ICD-10-CM | POA: Diagnosis not present

## 2022-03-06 DIAGNOSIS — Z85038 Personal history of other malignant neoplasm of large intestine: Secondary | ICD-10-CM | POA: Diagnosis not present

## 2022-03-11 DIAGNOSIS — I10 Essential (primary) hypertension: Secondary | ICD-10-CM | POA: Diagnosis not present

## 2022-03-11 DIAGNOSIS — E7800 Pure hypercholesterolemia, unspecified: Secondary | ICD-10-CM | POA: Diagnosis not present

## 2022-03-12 DIAGNOSIS — D124 Benign neoplasm of descending colon: Secondary | ICD-10-CM | POA: Diagnosis not present

## 2022-03-12 DIAGNOSIS — Z980 Intestinal bypass and anastomosis status: Secondary | ICD-10-CM | POA: Diagnosis not present

## 2022-03-14 DIAGNOSIS — I10 Essential (primary) hypertension: Secondary | ICD-10-CM | POA: Diagnosis not present

## 2022-03-14 DIAGNOSIS — R42 Dizziness and giddiness: Secondary | ICD-10-CM | POA: Diagnosis not present

## 2022-03-14 DIAGNOSIS — N183 Chronic kidney disease, stage 3 unspecified: Secondary | ICD-10-CM | POA: Diagnosis not present

## 2022-03-14 DIAGNOSIS — F324 Major depressive disorder, single episode, in partial remission: Secondary | ICD-10-CM | POA: Diagnosis not present

## 2022-03-14 DIAGNOSIS — Z0000 Encounter for general adult medical examination without abnormal findings: Secondary | ICD-10-CM | POA: Diagnosis not present

## 2022-03-14 DIAGNOSIS — E7800 Pure hypercholesterolemia, unspecified: Secondary | ICD-10-CM | POA: Diagnosis not present

## 2022-04-01 DIAGNOSIS — M5412 Radiculopathy, cervical region: Secondary | ICD-10-CM | POA: Diagnosis not present

## 2022-04-30 DIAGNOSIS — M1711 Unilateral primary osteoarthritis, right knee: Secondary | ICD-10-CM | POA: Diagnosis not present

## 2022-05-06 DIAGNOSIS — M1711 Unilateral primary osteoarthritis, right knee: Secondary | ICD-10-CM | POA: Diagnosis not present

## 2022-05-14 DIAGNOSIS — M1711 Unilateral primary osteoarthritis, right knee: Secondary | ICD-10-CM | POA: Diagnosis not present

## 2022-05-15 DIAGNOSIS — M5416 Radiculopathy, lumbar region: Secondary | ICD-10-CM | POA: Diagnosis not present

## 2022-05-15 DIAGNOSIS — M4326 Fusion of spine, lumbar region: Secondary | ICD-10-CM | POA: Diagnosis not present

## 2022-05-15 DIAGNOSIS — M5412 Radiculopathy, cervical region: Secondary | ICD-10-CM | POA: Diagnosis not present

## 2022-05-21 ENCOUNTER — Other Ambulatory Visit: Payer: Self-pay | Admitting: Rehabilitation

## 2022-05-21 DIAGNOSIS — M5416 Radiculopathy, lumbar region: Secondary | ICD-10-CM

## 2022-06-01 ENCOUNTER — Other Ambulatory Visit: Payer: Self-pay | Admitting: Rehabilitation

## 2022-06-01 ENCOUNTER — Ambulatory Visit
Admission: RE | Admit: 2022-06-01 | Discharge: 2022-06-01 | Disposition: A | Discharge status: Home / Self Care | Payer: Medicare PPO | Source: Ambulatory Visit | Attending: Rehabilitation | Admitting: Rehabilitation

## 2022-06-01 DIAGNOSIS — M5416 Radiculopathy, lumbar region: Secondary | ICD-10-CM

## 2022-06-01 DIAGNOSIS — M545 Low back pain, unspecified: Secondary | ICD-10-CM | POA: Diagnosis not present

## 2022-06-05 DIAGNOSIS — M5416 Radiculopathy, lumbar region: Secondary | ICD-10-CM | POA: Diagnosis not present

## 2022-06-05 DIAGNOSIS — M47812 Spondylosis without myelopathy or radiculopathy, cervical region: Secondary | ICD-10-CM | POA: Diagnosis not present

## 2022-06-09 DIAGNOSIS — M5416 Radiculopathy, lumbar region: Secondary | ICD-10-CM | POA: Diagnosis not present

## 2022-06-19 DIAGNOSIS — M25561 Pain in right knee: Secondary | ICD-10-CM | POA: Diagnosis not present

## 2022-06-24 DIAGNOSIS — M25561 Pain in right knee: Secondary | ICD-10-CM | POA: Diagnosis not present

## 2022-06-27 DIAGNOSIS — Z96651 Presence of right artificial knee joint: Secondary | ICD-10-CM | POA: Diagnosis not present

## 2022-06-30 DIAGNOSIS — M5416 Radiculopathy, lumbar region: Secondary | ICD-10-CM | POA: Diagnosis not present

## 2022-07-04 DIAGNOSIS — Z96651 Presence of right artificial knee joint: Secondary | ICD-10-CM | POA: Diagnosis not present

## 2022-07-21 DIAGNOSIS — M1711 Unilateral primary osteoarthritis, right knee: Secondary | ICD-10-CM | POA: Diagnosis not present

## 2022-08-18 DIAGNOSIS — N3281 Overactive bladder: Secondary | ICD-10-CM | POA: Diagnosis not present

## 2022-08-18 DIAGNOSIS — N3000 Acute cystitis without hematuria: Secondary | ICD-10-CM | POA: Diagnosis not present

## 2022-08-26 NOTE — Patient Instructions (Signed)
DUE TO COVID-19 ONLY TWO VISITORS  (aged 81 and older)  ARE ALLOWED TO COME WITH YOU AND STAY IN THE WAITING ROOM ONLY DURING PRE OP AND PROCEDURE.   **NO VISITORS ARE ALLOWED IN THE SHORT STAY AREA OR RECOVERY ROOM!!**  IF YOU WILL BE ADMITTED INTO THE HOSPITAL YOU ARE ALLOWED ONLY FOUR SUPPORT PEOPLE DURING VISITATION HOURS ONLY (7 AM -8PM)   The support person(s) must pass our screening, gel in and out, and wear a mask at all times, including in the patient's room. Patients must also wear a mask when staff or their support person are in the room. Visitors GUEST BADGE MUST BE WORN VISIBLY  One adult visitor may remain with you overnight and MUST be in the room by 8 P.M.     Your procedure is scheduled on: 09/09/22   Report to [facility] Main Entrance    Report to admitting at: 6:00 AM   Call this number if you have problems the morning of surgery [phone]   Do not eat food :After Midnight.   After Midnight you may have the following liquids until : 5:30 AM DAY OF SURGERY  Water Black Coffee (sugar ok, NO MILK/CREAM OR CREAMERS)  Tea (sugar ok, NO MILK/CREAM OR CREAMERS) regular and decaf                             Plain Jell-O (NO RED)                                           Fruit ices (not with fruit pulp, NO RED)                                     Popsicles (NO RED)                                                                  Juice: apple, WHITE grape, WHITE cranberry Sports drinks like Gatorade (NO RED)   The day of surgery:  Drink ONE (1) Pre-Surgery Clear Ensure or G2 at : 5:30 AM the morning of surgery. Drink in one sitting. Do not sip.  This drink was given to you during your hospital  pre-op appointment visit. Nothing else to drink after completing the  Pre-Surgery Clear Ensure or G2.          If you have questions, please contact your surgeon's office.    Oral Hygiene is also important to reduce your risk of infection.                                     Remember - BRUSH YOUR TEETH THE MORNING OF SURGERY WITH YOUR REGULAR TOOTHPASTE   Do NOT smoke after Midnight   Take these medicines the morning of surgery with A SIP OF WATER: TOPAMAX,BUPROPION,SERTRALINE,VERAPAMIL.diazepam,MYRBETRIQ,AS NEEDED  You may not have any metal on your body including hair pins, jewelry, and body piercing             Do not wear make-up, lotions, powders, perfumes/cologne, or deodorant  Do not wear nail polish including gel and S&S, artificial/acrylic nails, or any other type of covering on natural nails including finger and toenails. If you have artificial nails, gel coating, etc. that needs to be removed by a nail salon please have this removed prior to surgery or surgery may need to be canceled/ delayed if the surgeon/ anesthesia feels like they are unable to be safely monitored.   Do not shave  48 hours prior to surgery.   Do not bring valuables to the hospital. [place].   Contacts, dentures or bridgework may not be worn into surgery.   Bring small overnight bag day of surgery.   DO NOT [place]. PHARMACY WILL DISPENSE MEDICATIONS LISTED ON YOUR MEDICATION LIST TO YOU DURING YOUR ADMISSION [place]!    Patients discharged on the day of surgery will not be allowed to drive home.  Someone NEEDS to stay with you for the first 24 hours after anesthesia.   Special Instructions: Bring a copy of your healthcare power of attorney and living will documents         the day of surgery if you haven't scanned them before.              Please read over the following fact sheets you were given: IF YOU HAVE QUESTIONS ABOUT YOUR PRE-OP INSTRUCTIONS PLEASE CALL [phone]    [facility] - Preparing for Surgery Before surgery, you can play an important role.  Because skin is not sterile, your skin needs to be as free of germs as possible.  You  can reduce the number of germs on your skin by washing with CHG (chlorahexidine gluconate) soap before surgery.  CHG is an antiseptic cleaner which kills germs and bonds with the skin to continue killing germs even after washing. Please DO NOT use if you have an allergy to CHG or antibacterial soaps.  If your skin becomes reddened/irritated stop using the CHG and inform your nurse when you arrive at Short Stay. Do not shave (including legs and underarms) for at least 48 hours prior to the first CHG shower.  You may shave your face/neck. Please follow these instructions carefully:  1.  Shower with CHG Soap the night before surgery and the  morning of Surgery.  2.  If you choose to wash your hair, wash your hair first as usual with your  normal  shampoo.  3.  After you shampoo, rinse your hair and body thoroughly to remove the  shampoo.                           4.  Use CHG as you would any other liquid soap.  You can apply chg directly  to the skin and wash                       Gently with a scrungie or clean washcloth.  5.  Apply the CHG Soap to your body ONLY FROM THE NECK DOWN.   Do not use on face/ open  Wound or open sores. Avoid contact with eyes, ears mouth and genitals (private parts).                       Wash face,  Genitals (private parts) with your normal soap.             6.  Wash thoroughly, paying special attention to the area where your surgery  will be performed.  7.  Thoroughly rinse your body with warm water from the neck down.  8.  DO NOT shower/wash with your normal soap after using and rinsing off  the CHG Soap.                9.  Pat yourself dry with a clean towel.            10.  Wear clean pajamas.            11.  Place clean sheets on your bed the night of your first shower and do not  sleep with pets. Day of Surgery : Do not apply any lotions/deodorants the morning of surgery.  Please wear clean clothes to the hospital/surgery center.  FAILURE  TO FOLLOW THESE INSTRUCTIONS MAY RESULT IN THE CANCELLATION OF YOUR SURGERY PATIENT SIGNATURE_________________________________  NURSE SIGNATURE__________________________________  ________________________________________________________________________  Adam Phenix  An incentive spirometer is a tool that can help keep your lungs clear and active. This tool measures how well you are filling your lungs with each breath. Taking long deep breaths may help reverse or decrease the chance of developing breathing (pulmonary) problems (especially infection) following: A long period of time when you are unable to move or be active. BEFORE THE PROCEDURE  If the spirometer includes an indicator to show your best effort, your nurse or respiratory therapist will set it to a desired goal. If possible, sit up straight or lean slightly forward. Try not to slouch. Hold the incentive spirometer in an upright position. INSTRUCTIONS FOR USE  Sit on the edge of your bed if possible, or sit up as far as you can in bed or on a chair. Hold the incentive spirometer in an upright position. Breathe out normally. Place the mouthpiece in your mouth and seal your lips tightly around it. Breathe in slowly and as deeply as possible, raising the piston or the ball toward the top of the column. Hold your breath for 3-5 seconds or for as long as possible. Allow the piston or ball to fall to the bottom of the column. Remove the mouthpiece from your mouth and breathe out normally. Rest for a few seconds and repeat Steps 1 through 7 at least 10 times every 1-2 hours when you are awake. Take your time and take a few normal breaths between deep breaths. The spirometer may include an indicator to show your best effort. Use the indicator as a goal to work toward during each repetition. After each set of 10 deep breaths, practice coughing to be sure your lungs are clear. If you have an incision (the cut made at the time of  surgery), support your incision when coughing by placing a pillow or rolled up towels firmly against it. Once you are able to get out of bed, walk around indoors and cough well. You may stop using the incentive spirometer when instructed by your caregiver.  RISKS AND COMPLICATIONS Take your time so you do not get dizzy or light-headed. If you are in pain, you may need to take or ask  for pain medication before doing incentive spirometry. It is harder to take a deep breath if you are having pain. AFTER USE Rest and breathe slowly and easily. It can be helpful to keep track of a log of your progress. Your caregiver can provide you with a simple table to help with this. If you are using the spirometer at home, follow these instructions: [place] IF:  You are having difficultly using the spirometer. You have trouble using the spirometer as often as instructed. Your pain medication is not giving enough relief while using the spirometer. You develop fever of 100.5 F (38.1 C) or higher. SEEK IMMEDIATE MEDICAL CARE IF:  You cough up bloody sputum that had not been present before. You develop fever of 102 F (38.9 C) or greater. You develop worsening pain at or near the incision site. MAKE SURE YOU:  Understand these instructions. Will watch your condition. Will get help right away if you are not doing well or get worse. Document Released: 02/09/2007 Document Revised: 12/22/2011 Document Reviewed: 04/12/2007 [facility] Patient Information 2014 [place], [place].   ________________________________________________________________________

## 2022-08-27 ENCOUNTER — Other Ambulatory Visit: Payer: Self-pay

## 2022-08-27 ENCOUNTER — Encounter (HOSPITAL_COMMUNITY): Payer: Self-pay

## 2022-08-27 ENCOUNTER — Encounter (HOSPITAL_COMMUNITY)
Admission: RE | Admit: 2022-08-27 | Discharge: 2022-08-27 | Disposition: A | Discharge status: Home / Self Care | Payer: Medicare PPO | Source: Ambulatory Visit | Attending: Orthopedic Surgery | Admitting: Orthopedic Surgery

## 2022-08-27 VITALS — BP 139/65 | HR 69 | Temp 97.6°F | Ht 60.0 in | Wt 141.0 lb

## 2022-08-27 DIAGNOSIS — I10 Essential (primary) hypertension: Secondary | ICD-10-CM

## 2022-08-27 DIAGNOSIS — Z01818 Encounter for other preprocedural examination: Secondary | ICD-10-CM | POA: Diagnosis not present

## 2022-08-27 HISTORY — DX: Unspecified dementia, unspecified severity, without behavioral disturbance, psychotic disturbance, mood disturbance, and anxiety: F03.90

## 2022-08-27 LAB — BASIC METABOLIC PANEL
Anion gap: 5 (ref 5–15)
BUN: 21 mg/dL (ref 8–23)
CO2: 27 mmol/L (ref 22–32)
Calcium: 9.1 mg/dL (ref 8.9–10.3)
Chloride: 107 mmol/L (ref 98–111)
Creatinine, Ser: 1.12 mg/dL — ABNORMAL HIGH (ref 0.44–1.00)
GFR, Estimated: 49 mL/min — ABNORMAL LOW (ref >60)
Glucose, Bld: 117 mg/dL — ABNORMAL HIGH (ref 70–99)
Potassium: 4 mmol/L (ref 3.5–5.1)
Sodium: 139 mmol/L (ref 135–145)

## 2022-08-27 LAB — CBC
HCT: 36.6 % (ref 36.0–46.0)
Hemoglobin: 11.5 g/dL — ABNORMAL LOW (ref 12.0–15.0)
MCH: 31.3 pg (ref 26.0–34.0)
MCHC: 31.4 g/dL (ref 30.0–36.0)
MCV: 99.7 fL (ref 80.0–100.0)
Platelets: 170 10*3/uL (ref 150–400)
RBC: 3.67 MIL/uL — ABNORMAL LOW (ref 3.87–5.11)
RDW: 13 % (ref 11.5–15.5)
WBC: 4.2 10*3/uL (ref 4.0–10.5)
nRBC: 0 % (ref 0.0–0.2)

## 2022-08-27 LAB — SURGICAL PCR SCREEN
MRSA, PCR: NEGATIVE
Staphylococcus aureus: NEGATIVE

## 2022-08-27 NOTE — Progress Notes (Signed)
For Short Stay: [facility] appointment date:  Bowel Prep reminder:   For Anesthesia: PCP - Dr. Dwyane Luo Cardiologist -   Chest x-ray -  EKG -  Stress Test -  ECHO -  Cardiac Cath -  Pacemaker/ICD device last checked: Pacemaker orders received: Device Rep notified:  Spinal Cord Stimulator:  Sleep Study -  CPAP -   Fasting Blood Sugar -  Checks Blood Sugar _____ times a day Date and result of last Hgb A1c-  Last dose of GLP1 agonist-  GLP1 instructions:   Last dose of SGLT-2 inhibitors-  SGLT-2 instructions:   Blood Thinner Instructions: Aspirin Instructions: Last Dose:  Activity level: Can go up a flight of stairs and activities of daily living without stopping and without chest pain and/or shortness of breath   Able to exercise without chest pain and/or shortness of breath   Unable to go up a flight of stairs without chest pain and/or shortness of breath     Anesthesia review:   Patient denies shortness of breath, fever, cough and chest pain at PAT appointment   Patient verbalized understanding of instructions that were given to them at the PAT appointment. Patient was also instructed that they will need to review over the PAT instructions again at home before surgery.

## 2022-09-08 NOTE — Anesthesia Preprocedure Evaluation (Signed)
Anesthesia Evaluation  Patient identified by MRN, date of birth, ID band Patient awake    Reviewed: Allergy & Precautions, H&P , NPO status , Patient's Chart, lab work & pertinent test results  Airway Mallampati: II  TM Distance: >3 FB Neck ROM: Full    Dental no notable dental hx. (+) Teeth Intact, Dental Advisory Given   Pulmonary neg pulmonary ROS, former smoker   Pulmonary exam normal breath sounds clear to auscultation       Cardiovascular Exercise Tolerance: Good hypertension, Pt. on medications  Rhythm:Regular Rate:Normal     Neuro/Psych  Headaches      Dementia    GI/Hepatic negative GI ROS, Neg liver ROS,,,  Endo/Other  negative endocrine ROS    Renal/GU negative Renal ROS  negative genitourinary   Musculoskeletal  (+) Arthritis , Osteoarthritis,    Abdominal   Peds  Hematology negative hematology ROS (+)   Anesthesia Other Findings   Reproductive/Obstetrics negative OB ROS                             Anesthesia Physical Anesthesia Plan  ASA: 2  Anesthesia Plan: Spinal   Post-op Pain Management: Regional block* and Tylenol PO (pre-op)*   Induction: Intravenous  PONV Risk Score and Plan: 3 and Propofol infusion, Ondansetron and Dexamethasone  Airway Management Planned: Natural Airway and Simple Face Mask  Additional Equipment:   Intra-op Plan:   Post-operative Plan:   Informed Consent: I have reviewed the patients History and Physical, chart, labs and discussed the procedure including the risks, benefits and alternatives for the proposed anesthesia with the patient or authorized representative who has indicated his/her understanding and acceptance.     Dental advisory given  Plan Discussed with: CRNA  Anesthesia Plan Comments:        Anesthesia Quick Evaluation

## 2022-09-09 ENCOUNTER — Ambulatory Visit (HOSPITAL_BASED_OUTPATIENT_CLINIC_OR_DEPARTMENT_OTHER): Payer: Medicare PPO | Admitting: Anesthesiology

## 2022-09-09 ENCOUNTER — Inpatient Hospital Stay (HOSPITAL_COMMUNITY)
Admission: AD | Admit: 2022-09-09 | Discharge: 2022-09-17 | DRG: 470 | Disposition: A | Discharge status: Home Health | Payer: Medicare PPO | Attending: Orthopedic Surgery | Admitting: Orthopedic Surgery

## 2022-09-09 ENCOUNTER — Other Ambulatory Visit: Payer: Self-pay

## 2022-09-09 ENCOUNTER — Encounter (HOSPITAL_COMMUNITY): Payer: Self-pay | Admitting: Orthopedic Surgery

## 2022-09-09 ENCOUNTER — Ambulatory Visit (HOSPITAL_COMMUNITY): Payer: Medicare PPO | Admitting: Anesthesiology

## 2022-09-09 ENCOUNTER — Encounter (HOSPITAL_COMMUNITY)
Admission: AD | Disposition: A | Discharge status: Home Health | Payer: Self-pay | Source: Home / Self Care | Attending: Orthopedic Surgery

## 2022-09-09 DIAGNOSIS — G8929 Other chronic pain: Secondary | ICD-10-CM | POA: Diagnosis present

## 2022-09-09 DIAGNOSIS — M1711 Unilateral primary osteoarthritis, right knee: Secondary | ICD-10-CM

## 2022-09-09 DIAGNOSIS — Z882 Allergy status to sulfonamides status: Secondary | ICD-10-CM | POA: Diagnosis not present

## 2022-09-09 DIAGNOSIS — M549 Dorsalgia, unspecified: Secondary | ICD-10-CM | POA: Diagnosis present

## 2022-09-09 DIAGNOSIS — M65861 Other synovitis and tenosynovitis, right lower leg: Secondary | ICD-10-CM | POA: Diagnosis present

## 2022-09-09 DIAGNOSIS — M25461 Effusion, right knee: Secondary | ICD-10-CM | POA: Diagnosis not present

## 2022-09-09 DIAGNOSIS — I10 Essential (primary) hypertension: Secondary | ICD-10-CM | POA: Diagnosis present

## 2022-09-09 DIAGNOSIS — Z880 Allergy status to penicillin: Secondary | ICD-10-CM

## 2022-09-09 DIAGNOSIS — Z981 Arthrodesis status: Secondary | ICD-10-CM

## 2022-09-09 DIAGNOSIS — F0390 Unspecified dementia without behavioral disturbance: Secondary | ICD-10-CM | POA: Diagnosis present

## 2022-09-09 DIAGNOSIS — Z79899 Other long term (current) drug therapy: Secondary | ICD-10-CM

## 2022-09-09 DIAGNOSIS — Z96651 Presence of right artificial knee joint: Principal | ICD-10-CM

## 2022-09-09 DIAGNOSIS — G8918 Other acute postprocedural pain: Secondary | ICD-10-CM | POA: Diagnosis not present

## 2022-09-09 DIAGNOSIS — M25761 Osteophyte, right knee: Secondary | ICD-10-CM | POA: Diagnosis present

## 2022-09-09 DIAGNOSIS — E785 Hyperlipidemia, unspecified: Secondary | ICD-10-CM | POA: Diagnosis present

## 2022-09-09 DIAGNOSIS — M659 Synovitis and tenosynovitis, unspecified: Secondary | ICD-10-CM | POA: Diagnosis not present

## 2022-09-09 DIAGNOSIS — Z9049 Acquired absence of other specified parts of digestive tract: Secondary | ICD-10-CM | POA: Diagnosis not present

## 2022-09-09 DIAGNOSIS — Z881 Allergy status to other antibiotic agents status: Secondary | ICD-10-CM | POA: Diagnosis not present

## 2022-09-09 DIAGNOSIS — Z85038 Personal history of other malignant neoplasm of large intestine: Secondary | ICD-10-CM

## 2022-09-09 DIAGNOSIS — Z87440 Personal history of urinary (tract) infections: Secondary | ICD-10-CM

## 2022-09-09 DIAGNOSIS — R490 Dysphonia: Secondary | ICD-10-CM | POA: Diagnosis present

## 2022-09-09 DIAGNOSIS — Z85828 Personal history of other malignant neoplasm of skin: Secondary | ICD-10-CM

## 2022-09-09 DIAGNOSIS — Z87891 Personal history of nicotine dependence: Secondary | ICD-10-CM

## 2022-09-09 HISTORY — DX: Dysphonia: R49.0

## 2022-09-09 HISTORY — PX: TOTAL KNEE ARTHROPLASTY: SHX125

## 2022-09-09 SURGERY — ARTHROPLASTY, KNEE, TOTAL
Anesthesia: General | Site: Knee | Laterality: Right

## 2022-09-09 MED ORDER — NITROFURANTOIN MACROCRYSTAL 100 MG PO CAPS: 100.0000 mg | ORAL_CAPSULE | Freq: Two times a day (BID) | ORAL | Status: DC

## 2022-09-09 MED ORDER — ACETAMINOPHEN 10 MG/ML IV SOLN
INTRAVENOUS | Status: AC
  Filled 2022-09-09: qty 100

## 2022-09-09 MED ORDER — METHOCARBAMOL 500 MG PO TABS
500.0000 mg | ORAL_TABLET | Freq: Four times a day (QID) | ORAL | Status: DC | PRN
  Administered 2022-09-09 – 2022-09-17 (×17): 500 mg via ORAL
  Filled 2022-09-09 (×17): qty 1

## 2022-09-09 MED ORDER — LACTATED RINGERS IV SOLN: INTRAVENOUS | Status: DC

## 2022-09-09 MED ORDER — PROPOFOL 10 MG/ML IV BOLUS
INTRAVENOUS | Status: AC
  Filled 2022-09-09: qty 20

## 2022-09-09 MED ORDER — MIDAZOLAM HCL 2 MG/2ML IJ SOLN
INTRAMUSCULAR | Status: AC
  Filled 2022-09-09: qty 2

## 2022-09-09 MED ORDER — HYDROCHLOROTHIAZIDE 12.5 MG PO CAPS
12.5000 mg | ORAL_CAPSULE | Freq: Every day | ORAL | Status: DC
  Filled 2022-09-09: qty 1

## 2022-09-09 MED ORDER — STERILE WATER FOR IRRIGATION IR SOLN
Status: DC | PRN
  Administered 2022-09-09: 2000 mL

## 2022-09-09 MED ORDER — OXYCODONE HCL 5 MG PO TABS
10.0000 mg | ORAL_TABLET | ORAL | Status: DC | PRN
  Administered 2022-09-09 – 2022-09-12 (×5): 10 mg via ORAL
  Administered 2022-09-14 (×2): 15 mg via ORAL
  Filled 2022-09-09 (×2): qty 3
  Filled 2022-09-09: qty 2
  Filled 2022-09-09: qty 3

## 2022-09-09 MED ORDER — METHOCARBAMOL 500 MG IVPB - SIMPLE MED
INTRAVENOUS | Status: AC
  Filled 2022-09-09: qty 55

## 2022-09-09 MED ORDER — DEXAMETHASONE SODIUM PHOSPHATE 10 MG/ML IJ SOLN
INTRAMUSCULAR | Status: DC | PRN
  Administered 2022-09-09: 6 mg via INTRAVENOUS

## 2022-09-09 MED ORDER — LIDOCAINE HCL (PF) 2 % IJ SOLN
INTRAMUSCULAR | Status: AC
  Filled 2022-09-09: qty 5

## 2022-09-09 MED ORDER — POLYETHYLENE GLYCOL 3350 17 G PO PACK
17.0000 g | PACK | Freq: Two times a day (BID) | ORAL | Status: DC
  Administered 2022-09-09 – 2022-09-16 (×14): 17 g via ORAL
  Filled 2022-09-09 (×16): qty 1

## 2022-09-09 MED ORDER — MIDAZOLAM HCL 2 MG/2ML IJ SOLN
INTRAMUSCULAR | Status: DC | PRN
  Administered 2022-09-09: 1 mg via INTRAVENOUS

## 2022-09-09 MED ORDER — TOPIRAMATE 25 MG PO TABS: 50.0000 mg | ORAL_TABLET | Freq: Every evening | ORAL | Status: DC | PRN

## 2022-09-09 MED ORDER — SODIUM CHLORIDE 0.9 % IV SOLN: INTRAVENOUS | Status: DC

## 2022-09-09 MED ORDER — POVIDONE-IODINE 10 % EX SWAB
2.0000 | Freq: Once | CUTANEOUS | Status: AC
  Administered 2022-09-09: 2 via TOPICAL

## 2022-09-09 MED ORDER — ONDANSETRON HCL 4 MG/2ML IJ SOLN: 4.0000 mg | Freq: Four times a day (QID) | INTRAMUSCULAR | Status: DC | PRN

## 2022-09-09 MED ORDER — HYDROMORPHONE HCL 1 MG/ML IJ SOLN
0.5000 mg | INTRAMUSCULAR | Status: DC | PRN
  Administered 2022-09-09: 1 mg via INTRAVENOUS
  Administered 2022-09-09: 0.5 mg via INTRAVENOUS
  Administered 2022-09-13: 1 mg via INTRAVENOUS
  Filled 2022-09-09 (×3): qty 1

## 2022-09-09 MED ORDER — 0.9 % SODIUM CHLORIDE (POUR BTL) OPTIME
TOPICAL | Status: DC | PRN
  Administered 2022-09-09: 1000 mL

## 2022-09-09 MED ORDER — CEFAZOLIN SODIUM-DEXTROSE 2-4 GM/100ML-% IV SOLN
2.0000 g | INTRAVENOUS | Status: AC
  Administered 2022-09-09: 2 g via INTRAVENOUS
  Filled 2022-09-09: qty 100

## 2022-09-09 MED ORDER — CEPHALEXIN 250 MG PO CAPS
250.0000 mg | ORAL_CAPSULE | Freq: Every day | ORAL | Status: DC
  Administered 2022-09-09 – 2022-09-17 (×9): 250 mg via ORAL
  Filled 2022-09-09 (×9): qty 1

## 2022-09-09 MED ORDER — DEXAMETHASONE SODIUM PHOSPHATE 10 MG/ML IJ SOLN
INTRAMUSCULAR | Status: AC
  Filled 2022-09-09: qty 1

## 2022-09-09 MED ORDER — MENTHOL 3 MG MT LOZG: 1.0000 | LOZENGE | OROMUCOSAL | Status: DC | PRN

## 2022-09-09 MED ORDER — ONDANSETRON HCL 4 MG/2ML IJ SOLN
INTRAMUSCULAR | Status: DC | PRN
  Administered 2022-09-09: 4 mg via INTRAVENOUS

## 2022-09-09 MED ORDER — FENTANYL CITRATE (PF) 250 MCG/5ML IJ SOLN
INTRAMUSCULAR | Status: AC
  Filled 2022-09-09: qty 5

## 2022-09-09 MED ORDER — LIDOCAINE HCL (CARDIAC) PF 100 MG/5ML IV SOSY
PREFILLED_SYRINGE | INTRAVENOUS | Status: DC | PRN
  Administered 2022-09-09: 40 mg via INTRATRACHEAL

## 2022-09-09 MED ORDER — FENTANYL CITRATE PF 50 MCG/ML IJ SOSY
PREFILLED_SYRINGE | INTRAMUSCULAR | Status: AC
  Filled 2022-09-09: qty 1

## 2022-09-09 MED ORDER — TRANEXAMIC ACID-NACL 1000-0.7 MG/100ML-% IV SOLN
1000.0000 mg | INTRAVENOUS | Status: AC
  Administered 2022-09-09: 1000 mg via INTRAVENOUS
  Filled 2022-09-09: qty 100

## 2022-09-09 MED ORDER — DIPHENHYDRAMINE HCL 12.5 MG/5ML PO ELIX: 12.5000 mg | ORAL_SOLUTION | ORAL | Status: DC | PRN

## 2022-09-09 MED ORDER — EPHEDRINE SULFATE-NACL 50-0.9 MG/10ML-% IV SOSY
PREFILLED_SYRINGE | INTRAVENOUS | Status: DC | PRN
  Administered 2022-09-09: 5 mg via INTRAVENOUS

## 2022-09-09 MED ORDER — TRANEXAMIC ACID-NACL 1000-0.7 MG/100ML-% IV SOLN
1000.0000 mg | Freq: Once | INTRAVENOUS | Status: AC
  Administered 2022-09-09: 1000 mg via INTRAVENOUS
  Filled 2022-09-09: qty 100

## 2022-09-09 MED ORDER — ACETAMINOPHEN 10 MG/ML IV SOLN
INTRAVENOUS | Status: DC | PRN
  Administered 2022-09-09: 1000 mg via INTRAVENOUS

## 2022-09-09 MED ORDER — MIRABEGRON ER 25 MG PO TB24
50.0000 mg | ORAL_TABLET | Freq: Every day | ORAL | Status: DC
  Administered 2022-09-10 – 2022-09-17 (×8): 50 mg via ORAL
  Filled 2022-09-09 (×8): qty 2

## 2022-09-09 MED ORDER — PROPOFOL 1000 MG/100ML IV EMUL
INTRAVENOUS | Status: AC
  Filled 2022-09-09: qty 100

## 2022-09-09 MED ORDER — CHLORHEXIDINE GLUCONATE 0.12 % MT SOLN
15.0000 mL | Freq: Once | OROMUCOSAL | Status: AC
  Administered 2022-09-09: 15 mL via OROMUCOSAL

## 2022-09-09 MED ORDER — BUPIVACAINE-EPINEPHRINE (PF) 0.5% -1:200000 IJ SOLN
INTRAMUSCULAR | Status: DC | PRN
  Administered 2022-09-09: 20 mL via PERINEURAL

## 2022-09-09 MED ORDER — DOCUSATE SODIUM 100 MG PO CAPS
100.0000 mg | ORAL_CAPSULE | Freq: Two times a day (BID) | ORAL | Status: DC
  Administered 2022-09-09 – 2022-09-17 (×15): 100 mg via ORAL
  Filled 2022-09-09 (×16): qty 1

## 2022-09-09 MED ORDER — PHENOL 1.4 % MT LIQD: 1.0000 | OROMUCOSAL | Status: DC | PRN

## 2022-09-09 MED ORDER — CEFAZOLIN SODIUM-DEXTROSE 2-4 GM/100ML-% IV SOLN
2.0000 g | Freq: Four times a day (QID) | INTRAVENOUS | Status: AC
  Administered 2022-09-09 (×2): 2 g via INTRAVENOUS
  Filled 2022-09-09 (×2): qty 100

## 2022-09-09 MED ORDER — KETOROLAC TROMETHAMINE 30 MG/ML IJ SOLN
INTRAMUSCULAR | Status: DC | PRN
  Administered 2022-09-09: 30 mg

## 2022-09-09 MED ORDER — ONDANSETRON HCL 4 MG PO TABS
4.0000 mg | ORAL_TABLET | Freq: Four times a day (QID) | ORAL | Status: DC | PRN
  Administered 2022-09-13 – 2022-09-14 (×2): 4 mg via ORAL
  Filled 2022-09-09 (×3): qty 1

## 2022-09-09 MED ORDER — FENTANYL CITRATE (PF) 250 MCG/5ML IJ SOLN
INTRAMUSCULAR | Status: DC | PRN
  Administered 2022-09-09 (×4): 50 ug via INTRAVENOUS

## 2022-09-09 MED ORDER — FENTANYL CITRATE PF 50 MCG/ML IJ SOSY
50.0000 ug | PREFILLED_SYRINGE | Freq: Once | INTRAMUSCULAR | Status: AC
  Administered 2022-09-09: 50 ug via INTRAVENOUS
  Filled 2022-09-09: qty 2

## 2022-09-09 MED ORDER — ACETAMINOPHEN 500 MG PO TABS
1000.0000 mg | ORAL_TABLET | Freq: Four times a day (QID) | ORAL | Status: AC
  Administered 2022-09-09 – 2022-09-13 (×12): 1000 mg via ORAL
  Filled 2022-09-09 (×13): qty 2

## 2022-09-09 MED ORDER — PROPOFOL 10 MG/ML IV BOLUS
INTRAVENOUS | Status: DC | PRN
  Administered 2022-09-09: 110 mg via INTRAVENOUS
  Administered 2022-09-09 (×5): 20 mg via INTRAVENOUS

## 2022-09-09 MED ORDER — SIMVASTATIN 20 MG PO TABS: 20.0000 mg | ORAL_TABLET | Freq: Every day | ORAL | Status: DC

## 2022-09-09 MED ORDER — FENTANYL CITRATE PF 50 MCG/ML IJ SOSY
25.0000 ug | PREFILLED_SYRINGE | INTRAMUSCULAR | Status: DC | PRN
  Administered 2022-09-09: 50 ug via INTRAVENOUS
  Administered 2022-09-09 (×3): 25 ug via INTRAVENOUS

## 2022-09-09 MED ORDER — OXYCODONE HCL 5 MG PO TABS
ORAL_TABLET | ORAL | Status: AC
  Filled 2022-09-09: qty 1

## 2022-09-09 MED ORDER — METHOCARBAMOL 500 MG IVPB - SIMPLE MED
500.0000 mg | Freq: Four times a day (QID) | INTRAVENOUS | Status: DC | PRN
  Administered 2022-09-09: 500 mg via INTRAVENOUS

## 2022-09-09 MED ORDER — SODIUM CHLORIDE (PF) 0.9 % IJ SOLN
INTRAMUSCULAR | Status: DC | PRN
  Administered 2022-09-09: 30 mL

## 2022-09-09 MED ORDER — DEXAMETHASONE SODIUM PHOSPHATE 10 MG/ML IJ SOLN
10.0000 mg | Freq: Once | INTRAMUSCULAR | Status: AC
  Administered 2022-09-10: 10 mg via INTRAVENOUS
  Filled 2022-09-09: qty 1

## 2022-09-09 MED ORDER — ONDANSETRON HCL 4 MG/2ML IJ SOLN
INTRAMUSCULAR | Status: AC
  Filled 2022-09-09: qty 2

## 2022-09-09 MED ORDER — BISACODYL 10 MG RE SUPP: 10.0000 mg | Freq: Every day | RECTAL | Status: DC | PRN

## 2022-09-09 MED ORDER — SODIUM CHLORIDE (PF) 0.9 % IJ SOLN
INTRAMUSCULAR | Status: AC
  Filled 2022-09-09: qty 50

## 2022-09-09 MED ORDER — METOCLOPRAMIDE HCL 5 MG PO TABS: 5.0000 mg | ORAL_TABLET | Freq: Three times a day (TID) | ORAL | Status: DC | PRN

## 2022-09-09 MED ORDER — PROPOFOL 500 MG/50ML IV EMUL
INTRAVENOUS | Status: DC | PRN
  Administered 2022-09-09: 50 ug/kg/min via INTRAVENOUS

## 2022-09-09 MED ORDER — METOCLOPRAMIDE HCL 5 MG/ML IJ SOLN: 5.0000 mg | Freq: Three times a day (TID) | INTRAMUSCULAR | Status: DC | PRN

## 2022-09-09 MED ORDER — ASPIRIN 81 MG PO CHEW
81.0000 mg | CHEWABLE_TABLET | Freq: Two times a day (BID) | ORAL | Status: DC
  Administered 2022-09-09 – 2022-09-17 (×16): 81 mg via ORAL
  Filled 2022-09-09 (×17): qty 1

## 2022-09-09 MED ORDER — SODIUM CHLORIDE 0.9 % IR SOLN
Status: DC | PRN
  Administered 2022-09-09: 1000 mL

## 2022-09-09 MED ORDER — KETOROLAC TROMETHAMINE 30 MG/ML IJ SOLN
INTRAMUSCULAR | Status: AC
  Filled 2022-09-09: qty 1

## 2022-09-09 MED ORDER — SERTRALINE HCL 50 MG PO TABS
50.0000 mg | ORAL_TABLET | Freq: Every day | ORAL | Status: DC
  Administered 2022-09-10 – 2022-09-17 (×8): 50 mg via ORAL
  Filled 2022-09-09 (×10): qty 1

## 2022-09-09 MED ORDER — OXYCODONE HCL 5 MG PO TABS
5.0000 mg | ORAL_TABLET | ORAL | Status: DC | PRN
  Administered 2022-09-09: 10 mg via ORAL
  Administered 2022-09-09 – 2022-09-10 (×2): 5 mg via ORAL
  Administered 2022-09-10: 10 mg via ORAL
  Administered 2022-09-10: 5 mg via ORAL
  Administered 2022-09-11 (×2): 10 mg via ORAL
  Administered 2022-09-12: 5 mg via ORAL
  Administered 2022-09-13 – 2022-09-14 (×6): 10 mg via ORAL
  Filled 2022-09-09 (×8): qty 2
  Filled 2022-09-09: qty 1
  Filled 2022-09-09 (×2): qty 2
  Filled 2022-09-09: qty 1
  Filled 2022-09-09 (×6): qty 2
  Filled 2022-09-09: qty 1

## 2022-09-09 MED ORDER — BUPROPION HCL ER (XL) 150 MG PO TB24
150.0000 mg | ORAL_TABLET | Freq: Every day | ORAL | Status: DC
  Administered 2022-09-10 – 2022-09-17 (×8): 150 mg via ORAL
  Filled 2022-09-09 (×8): qty 1

## 2022-09-09 MED ORDER — BUPIVACAINE-EPINEPHRINE (PF) 0.5% -1:200000 IJ SOLN
INTRAMUSCULAR | Status: AC
  Filled 2022-09-09: qty 30

## 2022-09-09 MED ORDER — BUPIVACAINE-EPINEPHRINE (PF) 0.5% -1:200000 IJ SOLN
INTRAMUSCULAR | Status: DC | PRN
  Administered 2022-09-09: 30 mL

## 2022-09-09 MED ORDER — BUPIVACAINE IN DEXTROSE 0.75-8.25 % IT SOLN
INTRATHECAL | Status: DC | PRN
  Administered 2022-09-09: 1.6 mL via INTRATHECAL

## 2022-09-09 MED ORDER — DEXAMETHASONE SODIUM PHOSPHATE 10 MG/ML IJ SOLN: 8.0000 mg | Freq: Once | INTRAMUSCULAR | Status: DC

## 2022-09-09 MED ORDER — ORAL CARE MOUTH RINSE: 15.0000 mL | Freq: Once | OROMUCOSAL | Status: AC

## 2022-09-09 MED ORDER — VERAPAMIL HCL ER 240 MG PO TBCR
240.0000 mg | EXTENDED_RELEASE_TABLET | Freq: Every day | ORAL | Status: DC
  Administered 2022-09-10 – 2022-09-16 (×7): 240 mg via ORAL
  Filled 2022-09-09 (×7): qty 1

## 2022-09-09 MED ORDER — ATORVASTATIN CALCIUM 10 MG PO TABS
10.0000 mg | ORAL_TABLET | Freq: Every day | ORAL | Status: DC
  Administered 2022-09-09 – 2022-09-16 (×8): 10 mg via ORAL
  Filled 2022-09-09 (×8): qty 1

## 2022-09-09 MED ORDER — ACETAMINOPHEN 325 MG PO TABS
325.0000 mg | ORAL_TABLET | Freq: Four times a day (QID) | ORAL | Status: DC | PRN
  Administered 2022-09-13 – 2022-09-15 (×4): 650 mg via ORAL
  Filled 2022-09-09 (×4): qty 2

## 2022-09-09 SURGICAL SUPPLY — 53 items
ADH SKN CLS APL DERMABOND .7 (GAUZE/BANDAGES/DRESSINGS) ×1
ATTUNE MED ANAT PAT 35 KNEE (Knees) IMPLANT
ATTUNE PS FEM RT SZ 3 CEM KNEE (Femur) IMPLANT
ATTUNE PSRP INSR SZ3 7 KNEE (Insert) IMPLANT
BAG COUNTER SPONGE SURGICOUNT (BAG) IMPLANT
BAG SPEC THK2 15X12 ZIP CLS (MISCELLANEOUS)
BAG SPNG CNTER NS LX DISP (BAG)
BAG ZIPLOCK 12X15 (MISCELLANEOUS) IMPLANT
BASEPLATE TIBIAL ROTATING SZ 4 (Knees) IMPLANT
BLADE SAW SGTL 11.0X1.19X90.0M (BLADE) IMPLANT
BLADE SAW SGTL 13.0X1.19X90.0M (BLADE) ×2 IMPLANT
BNDG ELASTIC 6X5.8 VLCR STR LF (GAUZE/BANDAGES/DRESSINGS) ×2 IMPLANT
BOWL SMART MIX CTS (DISPOSABLE) ×2 IMPLANT
BSPLAT TIB 4 CMNT ROT PLAT STR (Knees) ×1 IMPLANT
CEMENT HV SMART SET (Cement) IMPLANT
CUFF TOURN SGL QUICK 34 (TOURNIQUET CUFF) ×1
CUFF TRNQT CYL 34X4.125X (TOURNIQUET CUFF) ×2 IMPLANT
DERMABOND ADVANCED .7 DNX12 (GAUZE/BANDAGES/DRESSINGS) ×2 IMPLANT
DRAPE U-SHAPE 47X51 STRL (DRAPES) ×2 IMPLANT
DRESSING AQUACEL AG SP 3.5X10 (GAUZE/BANDAGES/DRESSINGS) ×2 IMPLANT
DRSG AQUACEL AG ADV 3.5X10 (GAUZE/BANDAGES/DRESSINGS) IMPLANT
DRSG AQUACEL AG SP 3.5X10 (GAUZE/BANDAGES/DRESSINGS) ×1
DURAPREP 26ML APPLICATOR (WOUND CARE) ×4 IMPLANT
ELECT REM PT RETURN 15FT ADLT (MISCELLANEOUS) ×2 IMPLANT
GLOVE BIO SURGEON STRL SZ 6 (GLOVE) ×2 IMPLANT
GLOVE BIOGEL PI IND STRL 6.5 (GLOVE) ×2 IMPLANT
GLOVE BIOGEL PI IND STRL 7.5 (GLOVE) ×2 IMPLANT
GLOVE ORTHO TXT STRL SZ7.5 (GLOVE) ×4 IMPLANT
GOWN STRL REUS W/ TWL LRG LVL3 (GOWN DISPOSABLE) ×4 IMPLANT
GOWN STRL REUS W/TWL LRG LVL3 (GOWN DISPOSABLE) ×2
HANDPIECE INTERPULSE COAX TIP (DISPOSABLE) ×1
HOLDER FOLEY CATH W/STRAP (MISCELLANEOUS) IMPLANT
KIT TURNOVER KIT A (KITS) IMPLANT
MANIFOLD NEPTUNE II (INSTRUMENTS) ×2 IMPLANT
NDL SAFETY ECLIP 18X1.5 (MISCELLANEOUS) IMPLANT
NS IRRIG 1000ML POUR BTL (IV SOLUTION) ×2 IMPLANT
PACK TOTAL KNEE CUSTOM (KITS) ×2 IMPLANT
PIN FIX SIGMA LCS THRD HI (PIN) IMPLANT
PROTECTOR NERVE ULNAR (MISCELLANEOUS) ×2 IMPLANT
SET HNDPC FAN SPRY TIP SCT (DISPOSABLE) ×2 IMPLANT
SET PAD KNEE POSITIONER (MISCELLANEOUS) ×2 IMPLANT
SPIKE FLUID TRANSFER (MISCELLANEOUS) ×4 IMPLANT
SUT MNCRL AB 4-0 PS2 18 (SUTURE) ×2 IMPLANT
SUT STRATAFIX PDS+ 0 24IN (SUTURE) ×2 IMPLANT
SUT VIC AB 1 CT1 36 (SUTURE) ×2 IMPLANT
SUT VIC AB 2-0 CT1 27 (SUTURE) ×2
SUT VIC AB 2-0 CT1 TAPERPNT 27 (SUTURE) ×4 IMPLANT
SYR 3ML LL SCALE MARK (SYRINGE) ×2 IMPLANT
TOWEL GREEN STERILE FF (TOWEL DISPOSABLE) ×2 IMPLANT
TRAY FOLEY MTR SLVR 16FR STAT (SET/KITS/TRAYS/PACK) ×2 IMPLANT
TUBE SUCTION HIGH CAP CLEAR NV (SUCTIONS) ×2 IMPLANT
WATER STERILE IRR 1000ML POUR (IV SOLUTION) ×4 IMPLANT
WRAP KNEE MAXI GEL POST OP (GAUZE/BANDAGES/DRESSINGS) ×2 IMPLANT

## 2022-09-09 NOTE — Interval H&P Note (Signed)
History and Physical Interval Note:  09/09/2022 7:06 AM  Tina Price  has presented today for surgery, with the diagnosis of Right knee osteoarthritis.  The various methods of treatment have been discussed with the patient and family. After consideration of risks, benefits and other options for treatment, the patient has consented to  Procedure(s): TOTAL KNEE ARTHROPLASTY (Right) as a surgical intervention.  The patient's history has been reviewed, patient examined, no change in status, stable for surgery.  I have reviewed the patient's chart and labs.  Questions were answered to the patient's satisfaction.     Mauri Pole

## 2022-09-09 NOTE — Discharge Instructions (Addendum)
INSTRUCTIONS AFTER JOINT REPLACEMENT   Pain Regimen: - Take Tylenol 1000 mg every 6 hours around the clock - Take methocarbamol (muscle relaxant) every 6 hours around the clock  - Take hydromorphone/dilaudid (pain medication) as needed every 4-6 hours or less   Please avoid taking your valium/diazepam while you are taking strong pain medication (oxycodone) as these medications together increase your risk of sedation/overdose.   Remove items at home which could result in a fall. This includes throw rugs or furniture in walking pathways ICE to the affected joint every three hours while awake for 30 minutes at a time, for at least the first 3-5 days, and then as needed for pain and swelling.  Continue to use ice for pain and swelling. You may notice swelling that will progress down to the foot and ankle.  This is normal after surgery.  Elevate your leg when you are not up walking on it.   Continue to use the breathing machine you got in the hospital (incentive spirometer) which will help keep your temperature down.  It is common for your temperature to cycle up and down following surgery, especially at night when you are not up moving around and exerting yourself.  The breathing machine keeps your lungs expanded and your temperature down.   DIET:  As you were doing prior to hospitalization, we recommend a well-balanced diet.  DRESSING / WOUND CARE / SHOWERING  Keep the surgical dressing until follow up.  The dressing is water proof, so you can shower without any extra covering.  IF THE DRESSING FALLS OFF or the wound gets wet inside, change the dressing with sterile gauze.  Please use good hand washing techniques before changing the dressing.  Do not use any lotions or creams on the incision until instructed by your surgeon.    ACTIVITY  Increase activity slowly as tolerated, but follow the weight bearing instructions below.   No driving for 6 weeks or until further direction given by your  physician.  You cannot drive while taking narcotics.  No lifting or carrying greater than 10 lbs. until further directed by your surgeon. Avoid periods of inactivity such as sitting longer than an hour when not asleep. This helps prevent blood clots.  You may return to work once you are authorized by your doctor.     WEIGHT BEARING   Weight bearing as tolerated with assist device (walker, cane, etc) as directed, use it as long as suggested by your surgeon or therapist, typically at least 4-6 weeks.   EXERCISES  Results after joint replacement surgery are often greatly improved when you follow the exercise, range of motion and muscle strengthening exercises prescribed by your doctor. Safety measures are also important to protect the joint from further injury. Any time any of these exercises cause you to have increased pain or swelling, decrease what you are doing until you are comfortable again and then slowly increase them. If you have problems or questions, call your caregiver or physical therapist for advice.   Rehabilitation is important following a joint replacement. After just a few days of immobilization, the muscles of the leg can become weakened and shrink (atrophy).  These exercises are designed to build up the tone and strength of the thigh and leg muscles and to improve motion. Often times heat used for twenty to thirty minutes before working out will loosen up your tissues and help with improving the range of motion but do not use heat for the first two  weeks following surgery (sometimes heat can increase post-operative swelling).   These exercises can be done on a training (exercise) mat, on the floor, on a table or on a bed. Use whatever works the best and is most comfortable for you.    Use music or television while you are exercising so that the exercises are a pleasant break in your day. This will make your life better with the exercises acting as a break in your routine that you  can look forward to.   Perform all exercises about fifteen times, three times per day or as directed.  You should exercise both the operative leg and the other leg as well.  Exercises include:   Quad Sets - Tighten up the muscle on the front of the thigh (Quad) and hold for 5-10 seconds.   Straight Leg Raises - With your knee straight (if you were given a brace, keep it on), lift the leg to 60 degrees, hold for 3 seconds, and slowly lower the leg.  Perform this exercise against resistance later as your leg gets stronger.  Leg Slides: Lying on your back, slowly slide your foot toward your buttocks, bending your knee up off the floor (only go as far as is comfortable). Then slowly slide your foot back down until your leg is flat on the floor again.  Angel Wings: Lying on your back spread your legs to the side as far apart as you can without causing discomfort.  Hamstring Strength:  Lying on your back, push your heel against the floor with your leg straight by tightening up the muscles of your buttocks.  Repeat, but this time bend your knee to a comfortable angle, and push your heel against the floor.  You may put a pillow under the heel to make it more comfortable if necessary.   A rehabilitation program following joint replacement surgery can speed recovery and prevent re-injury in the future due to weakened muscles. Contact your doctor or a physical therapist for more information on knee rehabilitation.    CONSTIPATION  Constipation is defined medically as fewer than three stools per week and severe constipation as less than one stool per week.  Even if you have a regular bowel pattern at home, your normal regimen is likely to be disrupted due to multiple reasons following surgery.  Combination of anesthesia, postoperative narcotics, change in appetite and fluid intake all can affect your bowels.   YOU MUST use at least one of the following options; they are listed in order of increasing strength to  get the job done.  They are all available over the counter, and you may need to use some, POSSIBLY even all of these options:    Drink plenty of fluids (prune juice may be helpful) and high fiber foods Colace 100 mg by mouth twice a day  Senokot for constipation as directed and as needed Dulcolax (bisacodyl), take with full glass of water  Miralax (polyethylene glycol) once or twice a day as needed.  If you have tried all these things and are unable to have a bowel movement in the first 3-4 days after surgery call either your surgeon or your primary doctor.    If you experience loose stools or diarrhea, hold the medications until you stool forms back up.  If your symptoms do not get better within 1 week or if they get worse, check with your doctor.  If you experience "the worst abdominal pain ever" or develop nausea or vomiting, please  contact the office immediately for further recommendations for treatment.   ITCHING:  If you experience itching with your medications, try taking only a single pain pill, or even half a pain pill at a time.  You can also use Benadryl over the counter for itching or also to help with sleep.   TED HOSE STOCKINGS:  Use stockings on both legs until for at least 2 weeks or as directed by physician office. They may be removed at night for sleeping.  MEDICATIONS:  See your medication summary on the "After Visit Summary" that nursing will review with you.  You may have some home medications which will be placed on hold until you complete the course of blood thinner medication.  It is important for you to complete the blood thinner medication as prescribed.  PRECAUTIONS:  If you experience chest pain or shortness of breath - call 911 immediately for transfer to the hospital emergency department.   If you develop a fever greater that 101 F, purulent drainage from wound, increased redness or drainage from wound, foul odor from the wound/dressing, or calf pain - CONTACT YOUR  SURGEON.                                                   FOLLOW-UP APPOINTMENTS:  If you do not already have a post-op appointment, please call the office for an appointment to be seen by your surgeon.  Guidelines for how soon to be seen are listed in your "After Visit Summary", but are typically between 1-4 weeks after surgery.  OTHER INSTRUCTIONS:   Knee Replacement:  Do not place pillow under knee, focus on keeping the knee straight while resting. CPM instructions: 0-90 degrees, 2 hours in the morning, 2 hours in the afternoon, and 2 hours in the evening. Place foam block, curve side up under heel at all times except when in CPM or when walking.  DO NOT modify, tear, cut, or change the foam block in any way.  POST-OPERATIVE OPIOID TAPER INSTRUCTIONS: It is important to wean off of your opioid medication as soon as possible. If you do not need pain medication after your surgery it is ok to stop day one. Opioids include: Codeine, Hydrocodone(Norco, Vicodin), Oxycodone(Percocet, oxycontin) and hydromorphone amongst others.  Long term and even short term use of opiods can cause: Increased pain response Dependence Constipation Depression Respiratory depression And more.  Withdrawal symptoms can include Flu like symptoms Nausea, vomiting And more Techniques to manage these symptoms Hydrate well Eat regular healthy meals Stay active Use relaxation techniques(deep breathing, meditating, yoga) Do Not substitute Alcohol to help with tapering If you have been on opioids for less than two weeks and do not have pain than it is ok to stop all together.  Plan to wean off of opioids This plan should start within one week post op of your joint replacement. Maintain the same interval or time between taking each dose and first decrease the dose.  Cut the total daily intake of opioids by one tablet each day Next start to increase the time between doses. The last dose that should be eliminated is  the evening dose.   MAKE SURE YOU:  Understand these instructions.  Get help right away if you are not doing well or get worse.    Thank you for letting us be a  part of your medical care team.  It is a privilege we respect greatly.  We hope these instructions will help you stay on track for a fast and full recovery!

## 2022-09-09 NOTE — Anesthesia Postprocedure Evaluation (Signed)
Anesthesia Post Note  Patient: Nithya Meriweather  Procedure(s) Performed: TOTAL KNEE ARTHROPLASTY (Right: Knee)     Patient location during evaluation: PACU Anesthesia Type: General and Regional Level of consciousness: awake and alert Pain management: pain level controlled Vital Signs Assessment: post-procedure vital signs reviewed and stable Respiratory status: spontaneous breathing, nonlabored ventilation, respiratory function stable and patient connected to nasal cannula oxygen Cardiovascular status: blood pressure returned to baseline and stable Postop Assessment: no apparent nausea or vomiting Anesthetic complications: yes (Failed spinal)  No notable events documented.  Last Vitals:  Vitals:   09/09/22 1145 09/09/22 1158  BP:  (!) 141/56  Pulse:  (!) 56  Resp: 13 17  Temp:  36.7 C  SpO2: 99% 98%    Last Pain:  Vitals:   09/09/22 1205  TempSrc:   PainSc: 3                  Lillan Mccreadie,W. EDMOND

## 2022-09-09 NOTE — Anesthesia Procedure Notes (Signed)
Procedure Name: LMA Insertion Date/Time: 09/09/2022 9:08 AM  Performed by: Sharlette Dense, CRNAPatient Re-evaluated:Patient Re-evaluated prior to induction Oxygen Delivery Method: Circle system utilized Preoxygenation: Pre-oxygenation with 100% oxygen Induction Type: IV induction LMA: LMA inserted LMA Size: 4.0 Number of attempts: 1 Placement Confirmation: positive ETCO2 and breath sounds checked- equal and bilateral Tube secured with: Tape Dental Injury: Teeth and Oropharynx as per pre-operative assessment

## 2022-09-09 NOTE — Anesthesia Procedure Notes (Signed)
Anesthesia Regional Block: Adductor canal block   Pre-Anesthetic Checklist: , timeout performed,  Correct Patient, Correct Site, Correct Laterality,  Correct Procedure, Correct Position, site marked,  Risks and benefits discussed,  Pre-op evaluation,  At surgeon's request and post-op pain management  Laterality: Right  Prep: Maximum Sterile Barrier Precautions used, chloraprep       Needles:  Injection technique: Single-shot  Needle Type: Echogenic Stimulator Needle     Needle Length: 9cm  Needle Gauge: 21     Additional Needles:   Procedures:,,,, ultrasound used (permanent image in chart),,    Narrative:  Start time: 09/09/2022 7:48 AM End time: 09/09/2022 7:58 AM Injection made incrementally with aspirations every 5 mL.  Performed by: Personally  Anesthesiologist: Roderic Palau, MD  Additional Notes:

## 2022-09-09 NOTE — Anesthesia Procedure Notes (Signed)
Date/Time: 09/09/2022 8:40 AM  Performed by: Sharlette Dense, CRNAOxygen Delivery Method: Simple face mask

## 2022-09-09 NOTE — Evaluation (Signed)
Physical Therapy Evaluation Patient Details Name: Tina Price MRN: [id number] DOB: Apr 29, 1941 Today's Date: 09/09/2022  History of Present Illness  81 yo female s/p R TKA on 09/09/22. PMH: colon CA, lumbar fusion, dysphonia, dementia, RCR, HTN  Clinical Impression  Pt is s/p TKA resulting in the deficits listed below (see PT Problem List).  Pt amb ~ 5' with RW and min-mod assist, wt shifting/amb limited by pain.   Will continue to follow and progress as able in acute setting. Pt will benefit from skilled PT to increase their independence and safety with mobility to allow discharge to the venue listed below.         Recommendations for follow up therapy are one component of a multi-disciplinary discharge planning process, led by the attending physician.  Recommendations may be updated based on patient status, additional functional criteria and insurance authorization.  Follow Up Recommendations Follow physician's recommendations for discharge plan and follow up therapies      Assistance Recommended at Discharge Frequent or constant Supervision/Assistance  Patient can return home with the following  A little help with walking and/or transfers;A little help with bathing/dressing/bathroom;Assist for transportation;Help with stairs or ramp for entrance;Assistance with cooking/housework    Equipment Recommendations None recommended by PT  Recommendations for Other Services       Functional Status Assessment Patient has had a recent decline in their functional status and demonstrates the ability to make significant improvements in function in a reasonable and predictable amount of time.     Precautions / Restrictions Precautions Precautions: Fall;Knee Restrictions Weight Bearing Restrictions: No RLE Weight Bearing: Weight bearing as tolerated      Mobility  Bed Mobility Overal bed mobility: Needs Assistance Bed Mobility: Supine to Sit     Supine to sit: Min assist,  Mod assist     General bed mobility comments: assist  to progress RLE off bed  and to scoot to EOB - pad utilized; incr time    Transfers Overall transfer level: Needs assistance Equipment used: Rolling walker (2 wheels) Transfers: Sit to/from Stand Sit to Stand: Min assist           General transfer comment: assist to rise and transition to RW, cues for hand placement    Ambulation/Gait Ambulation/Gait assistance: Min assist, Mod assist Gait Distance (Feet): 5 Feet Assistive device: Rolling walker (2 wheels) Gait Pattern/deviations: Step-to pattern, Decreased stance time - right, Decreased weight shift to right       General Gait Details: cues for sequence and RW position from self, assist throughout to balance and maneuver RW  Stairs            Wheelchair Mobility    Modified Rankin (Stroke Patients Only)       Balance Overall balance assessment: Needs assistance         Standing balance support: Bilateral upper extremity supported, During functional activity, Reliant on assistive device for balance Standing balance-Leahy Scale: Poor                               Pertinent Vitals/Pain Pain Assessment Pain Assessment: Faces Faces Pain Scale: Hurts little more Pain Location: right knee Pain Descriptors / Indicators: Grimacing, Guarding, Sore Pain Intervention(s): Limited activity within patient's tolerance, Monitored during session, Premedicated before session, Repositioned    Home Living Family/patient expects to be discharged to:: Private residence Living Arrangements: Alone Available Help at Discharge: Family;Available 24 hours/day Type of Home:  House Home Access: Stairs to enter Entrance Stairs-Rails: Psychiatric nurse of Steps: 2-3   Home Layout: Two level;Able to live on main level with bedroom/bathroom Home Equipment: Conservation officer, nature (2 wheels)      Prior Function Prior Level of Function :  Independent/Modified Independent             Mobility Comments: amb with cane when going out.longer distances       Hand Dominance        Extremity/Trunk Assessment   Upper Extremity Assessment Upper Extremity Assessment: Overall WFL for tasks assessed    Lower Extremity Assessment Lower Extremity Assessment: RLE deficits/detail RLE Deficits / Details: ankle WFL, knee extension and hip flexion 2+ to 3/5       Communication   Communication: Other (comment) (dysphonia)  Cognition Arousal/Alertness: Awake/alert Behavior During Therapy: WFL for tasks assessed/performed Overall Cognitive Status: Within Functional Limits for tasks assessed                                          General Comments      Exercises Total Joint Exercises Ankle Circles/Pumps: AROM, Both, 10 reps   Assessment/Plan    PT Assessment Patient needs continued PT services  PT Problem List Decreased strength;Decreased range of motion;Decreased activity tolerance;Decreased mobility;Decreased knowledge of use of DME;Decreased balance;Decreased knowledge of precautions       PT Treatment Interventions DME instruction;Therapeutic exercise;Gait training;Functional mobility training;Therapeutic activities;Patient/family education;Stair training    PT Goals (Current goals can be found in the Care Plan section)  Acute Rehab PT Goals Patient Stated Goal: less pain PT Goal Formulation: With patient Time For Goal Achievement: 09/16/22 Potential to Achieve Goals: Good    Frequency 7X/week     Co-evaluation               AM-PAC PT "6 Clicks" Mobility  Outcome Measure Help needed turning from your back to your side while in a flat bed without using bedrails?: A Little Help needed moving from lying on your back to sitting on the side of a flat bed without using bedrails?: A Little Help needed moving to and from a bed to a chair (including a wheelchair)?: A Little Help needed  standing up from a chair using your arms (e.g., wheelchair or bedside chair)?: A Lot Help needed to walk in hospital room?: A Lot Help needed climbing 3-5 steps with a railing? : A Lot 6 Click Score: 15    End of Session Equipment Utilized During Treatment: Gait belt Activity Tolerance: Patient tolerated treatment well Patient left: with call bell/phone within reach;in chair;with chair alarm set;with family/visitor present Nurse Communication: Mobility status PT Visit Diagnosis: Other abnormalities of gait and mobility (R26.89);Difficulty in walking, not elsewhere classified (R26.2)    Time: 2229-7989 PT Time Calculation (min) (ACUTE ONLY): 26 min   Charges:   PT Evaluation $PT Eval Low Complexity: 1 Low PT Treatments $Gait Training: 8-22 mins        Baxter Flattery, PT  Acute Rehab Dept [facility]) [phone]  [facility] Weekend Pager [facility] only)  [phone]  09/09/2022   [facility] 09/09/2022, 3:41 PM

## 2022-09-09 NOTE — Care Plan (Signed)
Ortho Bundle Case Management Note  Patient Details  Name: Tina Price MRN: [id number] Date of Birth: [birth year]                  R TKA on 09/09/22. DCP: Home with son. DME: No needs. Has RW. PT: EO 12/1   DME Arranged:  N/A   Additional Comments: Please contact me with any questions of if this plan should need to change.  Dario Ave, Case Manager  [facility]  [phone] 09/09/2022, 10:42 AM

## 2022-09-09 NOTE — Transfer of Care (Signed)
Immediate Anesthesia Transfer of Care Note  Patient: Tina Price  Procedure(s) Performed: TOTAL KNEE ARTHROPLASTY (Right: Knee)  Patient Location: PACU  Anesthesia Type:GA combined with regional for post-op pain  Level of Consciousness: drowsy  Airway & Oxygen Therapy: Patient Spontanous Breathing and Patient connected to face mask oxygen  Post-op Assessment: Report given to RN and Post -op Vital signs reviewed and stable  Post vital signs: Reviewed and stable  Last Vitals:  Vitals Value Taken Time  BP    Temp 37 C 09/09/22 1015  Pulse    Resp    SpO2      Last Pain:  Vitals:   09/09/22 0800  TempSrc:   PainSc: 0-No pain         Complications: No notable events documented.

## 2022-09-09 NOTE — H&P (Signed)
TOTAL KNEE ADMISSION H&P  Patient is being admitted for right total knee arthroplasty.  Subjective:  Chief Complaint:right knee pain.  HPI: Tina Price, 81 y.o. female, has a history of pain and functional disability in the right knee due to arthritis and has failed non-surgical conservative treatments for greater than 12 weeks to includeNSAID's and/or analgesics, corticosteriod injections, and activity modification.  Onset of symptoms was gradual, starting 2 years ago with gradually worsening course since that time. The patient noted no past surgery on the right knee(s).  Patient currently rates pain in the right knee(s) at 8 out of 10 with activity. Patient has worsening of pain with activity and weight bearing and pain that interferes with activities of daily living.  Patient has evidence of joint space narrowing by imaging studies. . There is no active infection.  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   Past Medical History:  Diagnosis Date   Arthritis    Back pain, chronic    Cancer ([place])    skin   Chronic UTI    Dementia ([facility])    Headache    Hypertension     Past Surgical History:  Procedure Laterality Date   COLECTOMY  06/13/2021   DILATION AND CURETTAGE OF UTERUS     LUMBAR FUSION     SHOULDER ARTHROSCOPY WITH OPEN ROTATOR CUFF REPAIR      Current Facility-Administered Medications  Medication Dose Route Frequency Provider Last Rate Last Admin   ceFAZolin (ANCEF) IVPB 2g/100 mL premix  2 g Intravenous On Call to OR Irving Copas, PA-C       chlorhexidine (PERIDEX) 0.12 % solution 15 mL  15 mL Mouth/Throat Once Nilda Simmer, MD       Or   Oral care mouth rinse  15 mL Mouth Rinse Once Nilda Simmer, MD       dexamethasone (DECADRON) injection 8 mg  8 mg Intravenous Once Irving Copas, PA-C       fentaNYL (SUBLIMAZE) injection 50-100 mcg  50-100 mcg Intravenous Once Roderic Palau, MD       lactated ringers infusion   Intravenous Continuous Irving Copas, PA-C       lactated ringers infusion   Intravenous Continuous Nilda Simmer, MD       povidone-iodine 10 % swab 2 Application  2 Application Topical Once Irving Copas, PA-C       tranexamic acid (CYKLOKAPRON) IVPB 1,000 mg  1,000 mg Intravenous To OR Irving Copas, PA-C       Allergies  Allergen Reactions   Doxycycline Other (See Comments)    Knocks her out   Penicillins Rash    Reaction: was in her 20's.  Tolerates Cephalosporins     Sulfa Antibiotics Rash    Blistering in your mouth    Social History   Tobacco Use   Smoking status: Former    Packs/day: 0.50    Years: 15.00    Total pack years: 7.50    Types: Cigarettes    Quit date: 1972    Years since quitting: 51.9   Smokeless tobacco: Never  Substance Use Topics   Alcohol use: Yes  Comment: rare    Family History  Problem Relation Age of Onset   Breast cancer Neg Hx      Review of Systems  Constitutional:  Negative for chills and fever.  Respiratory:  Negative for cough and shortness of breath.   Cardiovascular:  Negative for chest pain.  Gastrointestinal:  Negative for nausea and vomiting.  Musculoskeletal:  Positive for arthralgias.     Objective:  Physical Exam Constitutional:      Appearance: Normal appearance.  HENT:     Head: Normocephalic and atraumatic.  Pulmonary:     Effort: Pulmonary effort is normal.  Neurological:     Mental Status: She is alert.   Right Knee:  Tender to palpation about the joint lines. AROM 0-120. No effusion. No erythema or warmth.   Vital signs in last 24 hours:     Labs:   Estimated body mass index is 27.54 kg/m as calculated from the following:   Height as of 08/27/22: 5' (1.524 m).   Weight as of 08/27/22: 64 kg.   Imaging Review Plain radiographs demonstrate severe degenerative joint disease of the right knee(s). The overall alignment isneutral. The bone quality appears to be adequate for age and reported activity level.      Assessment/Plan:  End stage arthritis, right knee   The patient history, physical examination, clinical judgment of the provider and imaging studies are consistent with end stage degenerative joint disease of the right knee(s) and total knee arthroplasty is deemed medically necessary. The treatment options including medical management, injection therapy arthroscopy and arthroplasty were discussed at length. The risks and benefits of total knee arthroplasty were presented and reviewed. The risks due to aseptic loosening, infection, stiffness, patella tracking problems, thromboembolic complications and other imponderables were discussed. The patient acknowledged the explanation, agreed to proceed with the plan and consent was signed. Patient is being admitted for inpatient treatment for surgery, pain control, PT, OT, prophylactic antibiotics, VTE prophylaxis, progressive ambulation and ADL's and discharge planning. The patient is planning to be discharged  home.  Therapy Plans: outpatient therapy at [facility] Disposition: Home with son, daughter, caregiver (1st week - 24/7 with kids, then caregiver 1-5 PM 5 days per week) Planned DVT Prophylaxis: aspirin '81mg'$  BID DME needed: none PCP: Dr. Harrington Challenger, clearance received TXA: IV Allergies: PCN - rash, sulfa drug - blisters, angioedema Anesthesia Concerns: none - hx of spinal fusion BMI:28.9 Last HgbA1c: Not diabetic   Other: - Has scoliosis with radiculopathy currently - Hx of colon CA - hx of hemicolectomy, no hx of VTE - oxycodone, robaxin, tylenol, Home with son, daughter,  caregiver (1st week - 24/7 with kids, then caregiver 1-5 PM 5 days per week)     Patient's anticipated LOS is less than 2 midnights, meeting these requirements: - Younger than 43 - Lives within 1 hour of care - Has a competent adult at home to recover with post-op recover - NO history of  - Chronic pain requiring opiods  - Diabetes  - Coronary Artery Disease  - Heart failure  - Heart attack  - Stroke  - DVT/VTE  - Cardiac arrhythmia  - Respiratory Failure/COPD  - Renal failure  - Anemia  - Advanced Liver disease  Costella Hatcher, PA-C Orthopedic Surgery [facility] [place] [phone]

## 2022-09-09 NOTE — Op Note (Signed)
NAME:  Tina Price                      MEDICAL RECORD NO.:  [id number]                             FACILITY:  [facility]      PHYSICIAN:  Pietro Cassis. Alvan Dame, M.D.  DATE OF BIRTH:  02-10-1941      DATE OF PROCEDURE:  09/09/2022                                     OPERATIVE REPORT         PREOPERATIVE DIAGNOSIS:  Right knee osteoarthritis.      POSTOPERATIVE DIAGNOSIS:  Right knee osteoarthritis.      FINDINGS:  The patient was noted to have complete loss of cartilage and   bone-on-bone arthritis with associated osteophytes in the lateral and patellofemoral compartments of   the knee with a significant synovitis and associated effusion.  The patient had failed months of conservative treatment including medications, injection therapy, activity modification.     PROCEDURE:  Right total knee replacement.      COMPONENTS USED:  DePuy Attune rotating platform posterior stabilized knee   system, a size 3 femur, 4 tibia, size 7 mm PS AOX insert, and 35 anatomic patellar   button.      SURGEON:  Pietro Cassis. Alvan Dame, M.D.      ASSISTANT:  Costella Hatcher, PA-C.      ANESTHESIA:  General, Regional, and Spinal.      SPECIMENS:  None.      COMPLICATION:  None.      DRAINS:  None.  EBL: <150cc      TOURNIQUET TIME:  27 min at 225 mmHg     The patient was stable to the recovery room.      INDICATION FOR PROCEDURE:  Tina Price is a 81 y.o. female patient of   mine.  The patient had been seen, evaluated, and treated for months conservatively in the   office with medication, activity modification, and injections.  The patient had   radiographic changes of bone-on-bone arthritis with endplate sclerosis and osteophytes noted.  Based on the radiographic changes and failed conservative measures, the patient   decided to proceed with definitive treatment, total knee replacement.  Risks of infection, DVT, component failure, need for revision surgery, neurovascular injury were  reviewed in the office setting.  The postop course was reviewed stressing the efforts to maximize post-operative satisfaction and function.  Consent was obtained for benefit of pain   relief.      PROCEDURE IN DETAIL:  The patient was brought to the operative theater.   Once adequate anesthesia, preoperative antibiotics, 2 gm of Ancef,1 gm of Tranexamic Acid, and 10 mg of Decadron administered, the patient was positioned supine with a right thigh tourniquet placed.  The  right lower extremity was prepped and draped in sterile fashion.  A time-   out was performed identifying the patient, planned procedure, and the appropriate extremity.      The right lower extremity was placed in the [facility] leg holder.  The leg was   exsanguinated, tourniquet elevated to 250 mmHg.  A midline incision was   made followed by median parapatellar arthrotomy.  Following initial   exposure, attention was  first directed to the patella.  Precut   measurement was noted to be 22 mm.  I resected down to 12 mm and used a   35 anatomic patellar button to restore patellar height as well as cover the cut surface.      The lug holes were drilled and a metal shim was placed to protect the   patella from retractors and saw blade during the procedure.      At this point, attention was now directed to the femur.  The femoral   canal was opened with a drill, irrigated to try to prevent fat emboli.  An   intramedullary rod was passed at 3 degrees valgus, 9 mm of bone was   resected off the distal femur.  Following this resection, the tibia was   subluxated anteriorly.  Using the extramedullary guide, 2 mm of bone was resected off   the proximal lateral tibia.  We confirmed the gap would be   stable medially and laterally with a size 5 spacer block as well as confirmed that the tibial cut was perpendicular in the coronal plane, checking with an alignment rod.      Once this was done, I sized the femur to be a size 3 in the  anterior-   posterior dimension, chose a standard component based on medial and   lateral dimension.  The size 3 rotation block was then pinned in   position anterior referenced using the C-clamp to set rotation.  The   anterior, posterior, and  chamfer cuts were made without difficulty nor   notching making certain that I was along the anterior cortex to help   with flexion gap stability.      The final box cut was made off the lateral aspect of distal femur.      At this point, the tibia was sized to be a size 4.  The size 4 tray was   then pinned in position through the medial third of the tubercle,   drilled, and keel punched.  Trial reduction was now carried with a 3 femur,  4 tibia, a size 7 mm PS insert, and the 35 anatomic patella botton.  The knee was brought to full extension with good flexion stability with the patella   tracking through the trochlea without application of pressure.  Given   all these findings the trial components removed.  Final components were   opened and cement was mixed.  The knee was irrigated with normal saline solution and pulse lavage.  The synovial lining was   then injected with 30 cc of 0.25% Marcaine with epinephrine, 1 cc of Toradol and 30 cc of NS for a total of 61 cc.     Final implants were then cemented onto cleaned and dried cut surfaces of bone with the knee brought to extension with a size 7 mm PS trial insert.      Once the cement had fully cured, excess cement was removed   throughout the knee.  I confirmed that I was satisfied with the range of   motion and stability, and the final size 7 mm PS AOX insert was chosen.  It was   placed into the knee.      The tourniquet had been let down at 27 minutes.  No significant   hemostasis was required.  The extensor mechanism was then reapproximated using #1 Vicryl and #1 Stratafix sutures with the knee   in flexion.  The  remaining wound was closed with 2-0 Vicryl and running 4-0 Monocryl.    The knee was cleaned, dried, dressed sterilely using Dermabond and   Aquacel dressing.  The patient was then   brought to recovery room in stable condition, tolerating the procedure   well.   Please note that Physician Assistant, Costella Hatcher, PA-C was present for the entirety of the case, and was utilized for pre-operative positioning, peri-operative retractor management, general facilitation of the procedure and for primary wound closure at the end of the case.              Pietro Cassis Alvan Dame, M.D.    09/09/2022 8:41 AM

## 2022-09-09 NOTE — Anesthesia Procedure Notes (Signed)
Spinal  Patient location during procedure: OR Start time: 09/09/2022 8:40 AM End time: 09/09/2022 8:44 AM Reason for block: surgical anesthesia Staffing Performed: anesthesiologist  Anesthesiologist: Roderic Palau, MD Performed by: Roderic Palau, MD Authorized by: Roderic Palau, MD   Preanesthetic Checklist Completed: patient identified, IV checked, risks and benefits discussed, surgical consent, monitors and equipment checked, pre-op evaluation and timeout performed Spinal Block Patient position: sitting Prep: DuraPrep Patient monitoring: cardiac monitor, continuous pulse ox and blood pressure Approach: midline Location: L3-4 Injection technique: single-shot Needle Needle type: Quincke  Needle gauge: 22 G Needle length: 9 cm Assessment Sensory level: T8 Events: CSF return Additional Notes Functioning IV was confirmed and monitors were applied. Sterile prep and drape, including hand hygiene and sterile gloves were used. The patient was positioned and the spine was prepped. The skin was anesthetized with lidocaine.  Free flow of clear CSF was obtained prior to injecting local anesthetic into the CSF.  The spinal needle aspirated freely following injection.  The needle was carefully withdrawn.  The patient tolerated the procedure well.

## 2022-09-10 ENCOUNTER — Encounter (HOSPITAL_COMMUNITY): Payer: Self-pay | Admitting: Orthopedic Surgery

## 2022-09-10 LAB — BASIC METABOLIC PANEL
Anion gap: 5 (ref 5–15)
BUN: 22 mg/dL (ref 8–23)
CO2: 26 mmol/L (ref 22–32)
Calcium: 8.8 mg/dL — ABNORMAL LOW (ref 8.9–10.3)
Chloride: 106 mmol/L (ref 98–111)
Creatinine, Ser: 1.08 mg/dL — ABNORMAL HIGH (ref 0.44–1.00)
GFR, Estimated: 52 mL/min — ABNORMAL LOW (ref >60)
Glucose, Bld: 143 mg/dL — ABNORMAL HIGH (ref 70–99)
Potassium: 4.7 mmol/L (ref 3.5–5.1)
Sodium: 137 mmol/L (ref 135–145)

## 2022-09-10 LAB — CBC
HCT: 29.5 % — ABNORMAL LOW (ref 36.0–46.0)
Hemoglobin: 9.5 g/dL — ABNORMAL LOW (ref 12.0–15.0)
MCH: 31.7 pg (ref 26.0–34.0)
MCHC: 32.2 g/dL (ref 30.0–36.0)
MCV: 98.3 fL (ref 80.0–100.0)
Platelets: 187 10*3/uL (ref 150–400)
RBC: 3 MIL/uL — ABNORMAL LOW (ref 3.87–5.11)
RDW: 12.8 % (ref 11.5–15.5)
WBC: 8.1 10*3/uL (ref 4.0–10.5)
nRBC: 0 % (ref 0.0–0.2)

## 2022-09-10 MED ORDER — SENNA 8.6 MG PO TABS: 1.0000 | ORAL_TABLET | Freq: Every day | ORAL | 0 refills | Status: AC

## 2022-09-10 MED ORDER — HYDROCHLOROTHIAZIDE 12.5 MG PO TABS
12.5000 mg | ORAL_TABLET | Freq: Every day | ORAL | Status: DC
  Administered 2022-09-10 – 2022-09-17 (×8): 12.5 mg via ORAL
  Filled 2022-09-10 (×8): qty 1

## 2022-09-10 MED ORDER — ASPIRIN 81 MG PO CHEW: 81.0000 mg | CHEWABLE_TABLET | Freq: Two times a day (BID) | ORAL | 0 refills | Status: AC

## 2022-09-10 MED ORDER — METHOCARBAMOL 500 MG PO TABS: 500.0000 mg | ORAL_TABLET | Freq: Four times a day (QID) | ORAL | 2 refills | Status: AC | PRN

## 2022-09-10 MED ORDER — OXYCODONE HCL 5 MG PO TABS: 5.0000 mg | ORAL_TABLET | ORAL | 0 refills | Status: DC | PRN

## 2022-09-10 MED ORDER — ACETAMINOPHEN 500 MG PO TABS: 1000.0000 mg | ORAL_TABLET | Freq: Four times a day (QID) | ORAL | 0 refills | Status: AC

## 2022-09-10 MED ORDER — POLYETHYLENE GLYCOL 3350 17 G PO PACK: 17.0000 g | PACK | Freq: Two times a day (BID) | ORAL | 0 refills | Status: AC

## 2022-09-10 NOTE — Plan of Care (Signed)
  Problem: Activity: Goal: Ability to avoid complications of mobility impairment will improve Outcome: Progressing Goal: Range of joint motion will improve Outcome: Progressing   Problem: Pain Management: Goal: Pain level will decrease with appropriate interventions Outcome: Progressing   

## 2022-09-10 NOTE — Progress Notes (Signed)
Physical Therapy Treatment Patient Details Name: Tina Price MRN: [id number] DOB: [birth year] Today's Date: 09/10/2022   History of Present Illness 81 yo female s/p R TKA on 09/09/22. PMH: colon CA, lumbar fusion, dysphonia, dementia, RCR, HTN    PT Comments    Pt is progressing however still unsteady with gait and requiring min assist overall/incr time to complete basic tasks. May need another day, will see how she does this afternoon. RN aware  Recommendations for follow up therapy are one component of a multi-disciplinary discharge planning process, led by the attending physician.  Recommendations may be updated based on patient status, additional functional criteria and insurance authorization.  Follow Up Recommendations  Follow physician's recommendations for discharge plan and follow up therapies     Assistance Recommended at Discharge Frequent or constant Supervision/Assistance  Patient can return home with the following A little help with walking and/or transfers;A little help with bathing/dressing/bathroom;Assist for transportation;Help with stairs or ramp for entrance;Assistance with cooking/housework   Equipment Recommendations  None recommended by PT    Recommendations for Other Services       Precautions / Restrictions Precautions Precautions: Fall;Knee Restrictions Weight Bearing Restrictions: No RLE Weight Bearing: Weight bearing as tolerated     Mobility  Bed Mobility   Bed Mobility: Supine to Sit     Supine to sit: Min guard     General bed mobility comments: incr time to complete transition however able to complete without physical assist    Transfers Overall transfer level: Needs assistance Equipment used: Rolling walker (2 wheels) Transfers: Sit to/from Stand Sit to Stand: Min assist           General transfer comment: assist to rise and transition to RW, cues for hand placement and RLE position     Ambulation/Gait Ambulation/Gait assistance: Min guard, Min assist Gait Distance (Feet): 50 Feet Assistive device: Rolling walker (2 wheels) Gait Pattern/deviations: Step-to pattern, Decreased stance time - right, Decreased weight shift to right       General Gait Details: cues for sequence and RW position from self, assist throughout to balance and maneuver RW   Stairs             Wheelchair Mobility    Modified Rankin (Stroke Patients Only)       Balance           Standing balance support: Bilateral upper extremity supported, During functional activity, Reliant on assistive device for balance Standing balance-Leahy Scale: Poor                              Cognition Arousal/Alertness: Awake/alert Behavior During Therapy: WFL for tasks assessed/performed Overall Cognitive Status: Within Functional Limits for tasks assessed                                          Exercises Total Joint Exercises Ankle Circles/Pumps: AROM, Both, 10 reps Quad Sets: AROM, Both, 10 reps Heel Slides: AAROM, Right, 10 reps Straight Leg Raises: AAROM, Right, 10 reps    General Comments        Pertinent Vitals/Pain Pain Assessment Pain Assessment: Faces Faces Pain Scale: Hurts little more Pain Location: right knee Pain Descriptors / Indicators: Grimacing, Guarding, Sore Pain Intervention(s): Limited activity within patient's tolerance, Monitored during session, Premedicated before session, Repositioned, Ice applied    Home  Living                          Prior Function            PT Goals (current goals can now be found in the care plan section) Acute Rehab PT Goals Patient Stated Goal: less pain PT Goal Formulation: With patient Time For Goal Achievement: 09/16/22 Potential to Achieve Goals: Good Progress towards PT goals: Progressing toward goals    Frequency    7X/week      PT Plan Current plan remains  appropriate    Co-evaluation              AM-PAC PT "6 Clicks" Mobility   Outcome Measure  Help needed turning from your back to your side while in a flat bed without using bedrails?: A Little Help needed moving from lying on your back to sitting on the side of a flat bed without using bedrails?: A Little Help needed moving to and from a bed to a chair (including a wheelchair)?: A Little Help needed standing up from a chair using your arms (e.g., wheelchair or bedside chair)?: A Little Help needed to walk in hospital room?: A Little Help needed climbing 3-5 steps with a railing? : A Lot 6 Click Score: 17    End of Session Equipment Utilized During Treatment: Gait belt Activity Tolerance: Patient tolerated treatment well Patient left: with call bell/phone within reach;in chair;with chair alarm set;with family/visitor present Nurse Communication: Mobility status PT Visit Diagnosis: Other abnormalities of gait and mobility (R26.89);Difficulty in walking, not elsewhere classified (R26.2)     Time: 5056-9794 PT Time Calculation (min) (ACUTE ONLY): 30 min  Charges:  $Gait Training: 8-22 mins $Therapeutic Exercise: 8-22 mins                     Baxter Flattery, PT  Acute Rehab Dept [facility]) [phone]  [facility] Weekend Pager [facility] only)  [phone]  09/10/2022    [facility] 09/10/2022, 10:27 AM

## 2022-09-10 NOTE — Plan of Care (Signed)
Plan of care reviewed and discussed with the patient. 

## 2022-09-10 NOTE — TOC Transition Note (Signed)
Transition of Care [facility]) - CM/SW Discharge Note   Patient Details  Name: Tina Price MRN: [id number] Date of Birth: 28-Dec-1940  [facility] CM/SW Contact:  Lennart Pall, LCSW Phone Number: 09/10/2022, 12:51 PM   Clinical Narrative:     Met with pt and son and confirming she has needed DME at home.  OPPT already arranged with [facility].  No TOC needs.  Final next level of care: OP Rehab Barriers to Discharge: No Barriers Identified   Patient Goals and CMS Choice Patient states their goals for this hospitalization and ongoing recovery are:: return home      Discharge Placement                       Discharge Plan and Services                DME Arranged: N/A                    Social Determinants of Health (SDOH) Interventions     Readmission Risk Interventions     No data to display

## 2022-09-10 NOTE — Progress Notes (Signed)
   Subjective: 1 Day Post-Op Procedure(s) (LRB): TOTAL KNEE ARTHROPLASTY (Right) Patient reports pain as mild.   Patient seen in rounds with Dr. Alvan Dame. Patient is well, and has had no acute complaints or problems. No acute events overnight. Foley catheter removed. Patient ambulated 5 feet with PT. Patient does have a dysphonia diagnosis - so she speaks in a soft tone. Will need assistance with ordering meals as they were not able to hear her over the phone today. We will continue therapy today.   Objective: Vital signs in last 24 hours: Temp:  [97.6 F (36.4 C)-98.6 F (37 C)] 98.3 F (36.8 C) (11/29 0617) Pulse Rate:  [52-73] 73 (11/29 0617) Resp:  [8-20] 16 (11/29 0617) BP: (106-168)/(52-79) 139/64 (11/29 0617) SpO2:  [92 %-100 %] 97 % (11/29 0617) Weight:  [64 kg] 64 kg (11/28 1400)  Intake/Output from previous day:  Intake/Output Summary (Last 24 hours) at 09/10/2022 0750 Last data filed at 09/10/2022 0655 Gross per 24 hour  Intake 3475.75 ml  Output 2725 ml  Net 750.75 ml     Intake/Output this shift: No intake/output data recorded.  Labs: Recent Labs    09/10/22 0333  HGB 9.5*   Recent Labs    09/10/22 0333  WBC 8.1  RBC 3.00*  HCT 29.5*  PLT 187   Recent Labs    09/10/22 0333  NA 137  K 4.7  CL 106  CO2 26  BUN 22  CREATININE 1.08*  GLUCOSE 143*  CALCIUM 8.8*   No results for input(s): "LABPT", "INR" in the last 72 hours.  Exam: General - Patient is Alert and Oriented Extremity - Neurologically intact Sensation intact distally Intact pulses distally Dorsiflexion/Plantar flexion intact Dressing - dressing C/D/I Motor Function - intact, moving foot and toes well on exam.   Past Medical History:  Diagnosis Date   Arthritis    Back pain, chronic    Cancer ([facility])    skin   Chronic UTI    Dementia ([facility])    Dysphonia    Headache    Hypertension     Assessment/Plan: 1 Day Post-Op Procedure(s) (LRB): TOTAL KNEE ARTHROPLASTY  (Right) Principal Problem:   S/P total knee arthroplasty, right  Estimated body mass index is 27.56 kg/m as calculated from the following:   Height as of this encounter: 5' (1.524 m).   Weight as of this encounter: 64 kg. Advance diet Up with therapy D/C IV fluids   Patient's anticipated LOS is less than 2 midnights, meeting these requirements: - Younger than 30 - Lives within 1 hour of care - Has a competent adult at home to recover with post-op recover - NO history of  - Chronic pain requiring opiods  - Diabetes  - Coronary Artery Disease  - Heart failure  - Heart attack  - Stroke  - DVT/VTE  - Cardiac arrhythmia  - Respiratory Failure/COPD  - Renal failure  - Anemia  - Advanced Liver disease     DVT Prophylaxis - Aspirin Weight bearing as tolerated.  Hgb stable at 9.5 this AM. Cr. 1.08  Plan is to go Home after hospital stay. Plan for discharge today following 1-2 sessions of PT as long as they are meeting their goals. Patient is scheduled for OPPT. Follow up in the office in 2 weeks.   Griffith Citron, PA-C Orthopedic Surgery [phone] 09/10/2022, 7:50 AM

## 2022-09-10 NOTE — Progress Notes (Signed)
Physical Therapy Treatment Patient Details Name: Tina Price MRN: [id number] DOB: 1940/12/31 Today's Date: 09/10/2022   History of Present Illness 81 yo female s/p R TKA on 09/09/22. PMH: colon CA, lumbar fusion, dysphonia, dementia, RCR, HTN    PT Comments    On PT arrival Pt attempting to  get urine soaked wash cloth off floor, chair soaked through 2 pads and sheet with pt on purewick. Assisted pt to [facility], with hygiene and donning depends garment at her request.  Pt will need another day to incr safety and independence prior to return home with family assist. Continue PT POC   Recommendations for follow up therapy are one component of a multi-disciplinary discharge planning process, led by the attending physician.  Recommendations may be updated based on patient status, additional functional criteria and insurance authorization.  Follow Up Recommendations  Follow physician's recommendations for discharge plan and follow up therapies     Assistance Recommended at Discharge Frequent or constant Supervision/Assistance  Patient can return home with the following A little help with walking and/or transfers;A little help with bathing/dressing/bathroom;Assist for transportation;Help with stairs or ramp for entrance;Assistance with cooking/housework   Equipment Recommendations  None recommended by PT    Recommendations for Other Services       Precautions / Restrictions Precautions Precautions: Fall;Knee Restrictions Weight Bearing Restrictions: No RLE Weight Bearing: Weight bearing as tolerated     Mobility  Bed Mobility   Bed Mobility: Sit to Supine     Supine to sit: Min guard Sit to supine: Min assist, Mod assist   General bed mobility comments: assist to lift bil LEs on to bed    Transfers Overall transfer level: Needs assistance Equipment used: Rolling walker (2 wheels) Transfers: Sit to/from Stand, Bed to chair/wheelchair/BSC Sit to Stand: Min  assist   Step pivot transfers: Min assist       General transfer comment: assist to rise, steady and transition to RW, cues for hand placement and RLE position on ascent adn descent; repeated STS to don depends garment    Ambulation/Gait Ambulation/Gait assistance: Min assist Gait Distance (Feet): 7 Feet Assistive device: Rolling walker (2 wheels) Gait Pattern/deviations: Step-to pattern, Decreased stance time - right, Decreased weight shift to right       General Gait Details: cues for sequence and RW position from self, assist throughout to balance and maneuver RW   Stairs             Wheelchair Mobility    Modified Rankin (Stroke Patients Only)       Balance           Standing balance support: Bilateral upper extremity supported, During functional activity, Reliant on assistive device for balance Standing balance-Leahy Scale: Poor                              Cognition Arousal/Alertness: Awake/alert Behavior During Therapy: WFL for tasks assessed/performed Overall Cognitive Status: Within Functional Limits for tasks assessed                                          Exercises Total Joint Exercises Ankle Circles/Pumps: AROM, Both, 10 reps Quad Sets: AROM, Both, 10 reps Heel Slides: AAROM, Right, 10 reps Straight Leg Raises: AAROM, Right, 10 reps    General Comments  Pertinent Vitals/Pain Pain Assessment Pain Assessment: Faces Faces Pain Scale: Hurts even more Pain Location: right knee Pain Descriptors / Indicators: Grimacing, Guarding, Sore Pain Intervention(s): Limited activity within patient's tolerance, Monitored during session, Premedicated before session, Repositioned, Patient requesting pain meds-RN notified    Home Living                          Prior Function            PT Goals (current goals can now be found in the care plan section) Acute Rehab PT Goals Patient Stated Goal: less  pain PT Goal Formulation: With patient Time For Goal Achievement: 09/16/22 Potential to Achieve Goals: Good Progress towards PT goals: Progressing toward goals    Frequency    7X/week      PT Plan Current plan remains appropriate    Co-evaluation              AM-PAC PT "6 Clicks" Mobility   Outcome Measure  Help needed turning from your back to your side while in a flat bed without using bedrails?: A Little Help needed moving from lying on your back to sitting on the side of a flat bed without using bedrails?: A Little Help needed moving to and from a bed to a chair (including a wheelchair)?: A Little Help needed standing up from a chair using your arms (e.g., wheelchair or bedside chair)?: A Little Help needed to walk in hospital room?: A Little Help needed climbing 3-5 steps with a railing? : A Lot 6 Click Score: 17    End of Session Equipment Utilized During Treatment: Gait belt Activity Tolerance: Patient tolerated treatment well Patient left: in bed;with call bell/phone within reach;with bed alarm set Nurse Communication: Mobility status PT Visit Diagnosis: Other abnormalities of gait and mobility (R26.89);Difficulty in walking, not elsewhere classified (R26.2)     Time: 3832-9191 PT Time Calculation (min) (ACUTE ONLY): 26 min  Charges:  $Gait Training: 8-22 mins $Therapeutic Activity: 8-22 mins                     Baxter Flattery, PT  Acute Rehab Dept [facility]) [phone]  [facility] Weekend Pager [facility] only)  [phone]  09/10/2022    [facility] 09/10/2022, 4:15 PM

## 2022-09-11 LAB — CBC
HCT: 31.7 % — ABNORMAL LOW (ref 36.0–46.0)
Hemoglobin: 10.3 g/dL — ABNORMAL LOW (ref 12.0–15.0)
MCH: 32 pg (ref 26.0–34.0)
MCHC: 32.5 g/dL (ref 30.0–36.0)
MCV: 98.4 fL (ref 80.0–100.0)
Platelets: 198 10*3/uL (ref 150–400)
RBC: 3.22 MIL/uL — ABNORMAL LOW (ref 3.87–5.11)
RDW: 13 % (ref 11.5–15.5)
WBC: 9.6 10*3/uL (ref 4.0–10.5)
nRBC: 0 % (ref 0.0–0.2)

## 2022-09-11 NOTE — Progress Notes (Signed)
Patient ID: Tina Price, female   DOB: 03-Dec-1940, 81 y.o.   MRN: [id number] Subjective: 2 Days Post-Op Procedure(s) (LRB): TOTAL KNEE ARTHROPLASTY (Right)    Patient reports pain as mild to moderate.  Made good progress with therapy yesterday.  No events. Ready to go home today.  Objective:   VITALS:   Vitals:   09/10/22 2355 09/11/22 0549  BP: (!) 168/58 (!) 165/69  Pulse: (!) 54 62  Resp:  18  Temp:  98.5 F (36.9 C)  SpO2:  97%    Neurovascular intact Incision: dressing C/D/I  LABS Recent Labs    09/10/22 0333 09/11/22 0345  HGB 9.5* 10.3*  HCT 29.5* 31.7*  WBC 8.1 9.6  PLT 187 198    Recent Labs    09/10/22 0333  NA 137  K 4.7  BUN 22  CREATININE 1.08*  GLUCOSE 143*    No results for input(s): "LABPT", "INR" in the last 72 hours.   Assessment/Plan: 2 Days Post-Op Procedure(s) (LRB): TOTAL KNEE ARTHROPLASTY (Right)   Up with therapy Home today after therapy RTC in 2 weeks Out patient therapy already set up

## 2022-09-11 NOTE — Progress Notes (Signed)
Physical Therapy Treatment Patient Details Name: Tina Price MRN: [id number] DOB: 08/09/1941 Today's Date: 09/11/2022   History of Present Illness 81 yo female s/p R TKA on 09/09/22. PMH: colon CA, lumbar fusion, dysphonia, dementia, RCR, HTN    PT Comments    Patient progressing slowly with mobility and limited by overall fatigue and decreased endurance. Patient required min assist to rise and steady balance with slight posterior lean in standing. Min assist to ambulate ~12' with RW. Pt's son provided chair follow for safety. EOS reviewed exercises for ROM and strengthening. Will continue to progress as able.   Recommendations for follow up therapy are one component of a multi-disciplinary discharge planning process, led by the attending physician.  Recommendations may be updated based on patient status, additional functional criteria and insurance authorization.  Follow Up Recommendations  Follow physician's recommendations for discharge plan and follow up therapies     Assistance Recommended at Discharge Frequent or constant Supervision/Assistance  Patient can return home with the following A little help with walking and/or transfers;A little help with bathing/dressing/bathroom;Assist for transportation;Help with stairs or ramp for entrance;Assistance with cooking/housework   Equipment Recommendations  None recommended by PT    Recommendations for Other Services       Precautions / Restrictions Precautions Precautions: Fall;Knee Restrictions Weight Bearing Restrictions: No RLE Weight Bearing: Weight bearing as tolerated     Mobility  Bed Mobility Overal bed mobility: Needs Assistance Bed Mobility: Supine to Sit     Supine to sit: Min assist, HOB elevated      cues for use of bed rail and to walk LE's off EOB. Min assist to fully pivot to sit up at edge and scoot to place feet on floor.     Transfers Overall transfer level: Needs assistance Equipment  used: Rolling walker (2 wheels) Transfers: Sit to/from Stand Sit to Stand: Min assist        Cues for safe hand placement and assist to steady balance with power up due to posterior lean         Ambulation/Gait Ambulation/Gait assistance: Min assist Gait Distance (Feet): 12 Feet Assistive device: Rolling walker (2 wheels) Gait Pattern/deviations: Step-to pattern, Decreased stance time - right, Decreased weight shift to right, Shuffle Gait velocity: decr    cues for upright posture and step pattern into RW. Assist to maintain balance as pt has slight posterior lean. distance limited by fatigue.      Stairs             Wheelchair Mobility    Modified Rankin (Stroke Patients Only)       Balance Overall balance assessment: Needs assistance Sitting-balance support: Feet supported       Standing balance support: Bilateral upper extremity supported, During functional activity, Reliant on assistive device for balance Standing balance-Leahy Scale: Poor                              Cognition Arousal/Alertness: Awake/alert Behavior During Therapy: WFL for tasks assessed/performed Overall Cognitive Status: Within Functional Limits for tasks assessed                                          Exercises     09/11/22 1021  Total Joint Exercises  Ankle Circles/Pumps AROM;Both;10 reps  Quad Sets AROM;Both;10 reps  Heel Slides AAROM;Right;10 reps  General Comments        Pertinent Vitals/Pain Pain Assessment Pain Assessment: Faces Faces Pain Scale: Hurts little more Pain Location: right knee Pain Descriptors / Indicators: Grimacing, Guarding, Sore Pain Intervention(s): Monitored during session, Limited activity within patient's tolerance, Repositioned    Home Living                          Prior Function            PT Goals (current goals can now be found in the care plan section) Acute Rehab PT Goals Patient  Stated Goal: less pain PT Goal Formulation: With patient Time For Goal Achievement: 09/16/22 Potential to Achieve Goals: Good Progress towards PT goals: Progressing toward goals    Frequency    7X/week      PT Plan Current plan remains appropriate    Co-evaluation              AM-PAC PT "6 Clicks" Mobility   Outcome Measure  Help needed turning from your back to your side while in a flat bed without using bedrails?: A Little Help needed moving from lying on your back to sitting on the side of a flat bed without using bedrails?: A Little Help needed moving to and from a bed to a chair (including a wheelchair)?: A Little Help needed standing up from a chair using your arms (e.g., wheelchair or bedside chair)?: A Little Help needed to walk in hospital room?: A Little Help needed climbing 3-5 steps with a railing? : A Lot 6 Click Score: 17    End of Session Equipment Utilized During Treatment: Gait belt Activity Tolerance: Patient tolerated treatment well Patient left: in bed;with call bell/phone within reach;with bed alarm set Nurse Communication: Mobility status PT Visit Diagnosis: Other abnormalities of gait and mobility (R26.89);Difficulty in walking, not elsewhere classified (R26.2)     Time: 5697-9480 PT Time Calculation (min) (ACUTE ONLY): 30 min  Charges:  $Gait Training: 8-22 mins $Therapeutic Activity: 8-22 mins                     Verner Mould, DPT Acute Rehabilitation Services Office [phone]  09/11/22 2:18 PM

## 2022-09-11 NOTE — Progress Notes (Addendum)
Physical Therapy Treatment Patient Details Name: Tina Price MRN: [id number] DOB: 12/16/1940 Today's Date: 09/11/2022   History of Present Illness 81 yo female s/p R TKA on 09/09/22. PMH: colon CA, lumbar fusion, dysphonia, dementia, RCR, HTN    PT Comments    Patient continues to progress but slow. Min assist to balance with sit<>stand and to manage walker and steady for gait. Pt initiated stair training today, pt challenged by short 4" step with bil UE support on rails. Mod assist provided to complete single step up and descend with step back. Seated rest provided as pt greatly fatigued. Discussed safety concerns with stairs with family and general weakness with limited gait distance. PTA for surgery pt was receiving [facility] services for assist and for therapy. Given slow progress recommend pt restart HHPT and [place] once safe to discharge home; anticipate she would have great difficulty traveling to OPPT multiple times per weak and would be at high fall risk. Discussed with PA-C, Costella Hatcher. Will progress as able.    Recommendations for follow up therapy are one component of a multi-disciplinary discharge planning process, led by the attending physician.  Recommendations may be updated based on patient status, additional functional criteria and insurance authorization.  Follow Up Recommendations  Follow physician's recommendations for discharge plan and follow up therapies (needs HHPT)     Assistance Recommended at Discharge Frequent or constant Supervision/Assistance  Patient can return home with the following A little help with walking and/or transfers;A little help with bathing/dressing/bathroom;Assist for transportation;Help with stairs or ramp for entrance;Assistance with cooking/housework   Equipment Recommendations  None recommended by PT    Recommendations for Other Services       Precautions / Restrictions Precautions Precautions:  Fall;Knee Restrictions Weight Bearing Restrictions: No RLE Weight Bearing: Weight bearing as tolerated     Mobility  Bed Mobility Overal bed mobility: Needs Assistance Bed Mobility: Sit to Supine     Supine to sit: Min assist, HOB elevated Sit to supine: Min assist, Mod assist   General bed mobility comments: assist to raise LE's back onto bed and cues for pt to use bed rail to control lowering.    Transfers Overall transfer level: Needs assistance Equipment used: Rolling walker (2 wheels) Transfers: Sit to/from Stand Sit to Stand: Min assist           General transfer comment: Pt using bil UE's on armrest to power up. Min assist to steady and prevent LOB posteriorly.    Ambulation/Gait Ambulation/Gait assistance: Min assist Gait Distance (Feet): 35 Feet Assistive device: Rolling walker (2 wheels) Gait Pattern/deviations: Step-to pattern, Decreased stance time - right, Decreased weight shift to right, Shuffle Gait velocity: decr     General Gait Details: pt moving slightly faster this session with gait. continues to require assist to advance RW appropriate distance. pt required standing rest break at ~20' due to UE fatigue.   Stairs Stairs: Yes Stairs assistance: Mod assist, +2 safety/equipment Stair Management: Two rails, Step to pattern, Forwards Number of Stairs: 1 (4" step) General stair comments: cues for hand placement and assist to block Rt knee to maintain extension in stance phase. Mod assist to steady and complete step up to single step. pt too step back and provided seated rest break.   Wheelchair Mobility    Modified Rankin (Stroke Patients Only)       Balance Overall balance assessment: Needs assistance Sitting-balance support: Feet supported       Standing balance support: Bilateral upper  extremity supported, During functional activity, Reliant on assistive device for balance Standing balance-Leahy Scale: Poor                               Cognition Arousal/Alertness: Awake/alert Behavior During Therapy: WFL for tasks assessed/performed Overall Cognitive Status: Within Functional Limits for tasks assessed                                          Exercises Total Joint Exercises Ankle Circles/Pumps: AROM, Both, 10 reps Quad Sets: AROM, Both, 10 reps Heel Slides: AAROM, Right, 10 reps    General Comments        Pertinent Vitals/Pain Pain Assessment Pain Assessment: Faces Faces Pain Scale: Hurts little more Pain Location: right knee Pain Descriptors / Indicators: Grimacing, Guarding, Sore Pain Intervention(s): Monitored during session, Limited activity within patient's tolerance, Repositioned    Home Living                          Prior Function            PT Goals (current goals can now be found in the care plan section) Acute Rehab PT Goals Patient Stated Goal: less pain PT Goal Formulation: With patient Time For Goal Achievement: 09/16/22 Potential to Achieve Goals: Good Progress towards PT goals: Progressing toward goals (slow)    Frequency    7X/week      PT Plan Current plan remains appropriate    Co-evaluation              AM-PAC PT "6 Clicks" Mobility   Outcome Measure  Help needed turning from your back to your side while in a flat bed without using bedrails?: A Little Help needed moving from lying on your back to sitting on the side of a flat bed without using bedrails?: A Little Help needed moving to and from a bed to a chair (including a wheelchair)?: A Little Help needed standing up from a chair using your arms (e.g., wheelchair or bedside chair)?: A Little Help needed to walk in hospital room?: A Little Help needed climbing 3-5 steps with a railing? : A Lot 6 Click Score: 17    End of Session Equipment Utilized During Treatment: Gait belt Activity Tolerance: Patient tolerated treatment well Patient left: in bed;with call  bell/phone within reach;with bed alarm set Nurse Communication: Mobility status PT Visit Diagnosis: Other abnormalities of gait and mobility (R26.89);Difficulty in walking, not elsewhere classified (R26.2)     Time: 0017-4944 PT Time Calculation (min) (ACUTE ONLY): 28 min  Charges:  $Gait Training: 23-37 mins                     Verner Mould, DPT Acute Rehabilitation Services Office [phone]  09/11/22 4:51 PM

## 2022-09-11 NOTE — Plan of Care (Signed)
  Problem: Education: Goal: Knowledge of the prescribed therapeutic regimen will improve Outcome: Progressing   Problem: Activity: Goal: Ability to avoid complications of mobility impairment will improve Outcome: Progressing Goal: Range of joint motion will improve Outcome: Progressing   Problem: Clinical Measurements: Goal: Postoperative complications will be avoided or minimized Outcome: Progressing   Problem: Pain Management: Goal: Pain level will decrease with appropriate interventions Outcome: Progressing   Problem: Skin Integrity: Goal: Will show signs of wound healing Outcome: Progressing   Problem: Education: Goal: Knowledge of the prescribed therapeutic regimen will improve Outcome: Progressing   Problem: Activity: Goal: Ability to avoid complications of mobility impairment will improve Outcome: Progressing Goal: Range of joint motion will improve Outcome: Progressing   Problem: Clinical Measurements: Goal: Postoperative complications will be avoided or minimized Outcome: Progressing   Problem: Pain Management: Goal: Pain level will decrease with appropriate interventions Outcome: Progressing   Problem: Skin Integrity: Goal: Will show signs of wound healing Outcome: Progressing   Problem: Education: Goal: Knowledge of General Education information will improve Description: Including pain rating scale, medication(s)/side effects and non-pharmacologic comfort measures Outcome: Progressing   Problem: Health Behavior/Discharge Planning: Goal: Ability to manage health-related needs will improve Outcome: Progressing   Problem: Activity: Goal: Risk for activity intolerance will decrease Outcome: Progressing   Problem: Coping: Goal: Level of anxiety will decrease Outcome: Progressing

## 2022-09-12 NOTE — Progress Notes (Signed)
Patient ID: Tina Price, female   DOB: 10/16/1940, 81 y.o.   MRN: [id number] Subjective: 3 Days Post-Op Procedure(s) (LRB): TOTAL KNEE ARTHROPLASTY (Right)    Patient reports pain as mild. Slower than anticipated recovery of function likely related to significant pre-operative deconditioning   Objective:   VITALS:   Vitals:   09/11/22 2016 09/12/22 0500  BP: (!) 163/60 (!) 155/74  Pulse: 69 70  Resp: 16 14  Temp: 98.5 F (36.9 C) 98.9 F (37.2 C)  SpO2: 96% 98%    Neurovascular intact Incision: dressing C/D/I  LABS Recent Labs    09/10/22 0333 09/11/22 0345  HGB 9.5* 10.3*  HCT 29.5* 31.7*  WBC 8.1 9.6  PLT 187 198    Recent Labs    09/10/22 0333  NA 137  K 4.7  BUN 22  CREATININE 1.08*  GLUCOSE 143*    No results for input(s): "LABPT", "INR" in the last 72 hours.   Assessment/Plan: 3 Days Post-Op Procedure(s) (LRB): TOTAL KNEE ARTHROPLASTY (Right)   Up with therapy Home when safe, likely trying to set up HHPT to assist with initial therapy efforts RTC in 2 weeks

## 2022-09-12 NOTE — Progress Notes (Addendum)
Physical Therapy Treatment Patient Details Name: Tina Price MRN: [id number] DOB: 1940/11/12 Today's Date: 09/12/2022   History of Present Illness 81 yo female s/p R TKA on 09/09/22. PMH: colon CA, lumbar fusion, dysphonia, dementia, RCR, HTN    PT Comments    Patient received in bed and having difficulty eating breakfast. Assist provided to sit up to EOB with cues for use of belt to assist Rt LE and use of bed pad to pivot/scoot to EOB fully. Patient with flexed posture in sitting and standing, cues to raise head/chest. Assist required to step to recliner and guide RW position. Reviewed ankle pumps for circulation and breakfast set up for pt to more easily eat. Will continue to progress as able. Pt's family will be in for training this afternoon.    Recommendations for follow up therapy are one component of a multi-disciplinary discharge planning process, led by the attending physician.  Recommendations may be updated based on patient status, additional functional criteria and insurance authorization.  Follow Up Recommendations  Follow physician's recommendations for discharge plan and follow up therapies     Assistance Recommended at Discharge Frequent or constant Supervision/Assistance  Patient can return home with the following A little help with walking and/or transfers;A little help with bathing/dressing/bathroom;Assist for transportation;Help with stairs or ramp for entrance;Assistance with cooking/housework   Equipment Recommendations  Rolling walker (2 wheels);BSC/3in1 (youth RW)    Recommendations for Other Services       Precautions / Restrictions Precautions Precautions: Fall;Knee Restrictions Weight Bearing Restrictions: No RLE Weight Bearing: Weight bearing as tolerated     Mobility  Bed Mobility Overal bed mobility: Needs Assistance Bed Mobility: Supine to Sit     Supine to sit: Min assist, HOB elevated     General bed mobility comments: cues for  use of belt to bring Rt LE off EOB, assist to fully pivot and scoot with bed pad to place feet on floor.    Transfers Overall transfer level: Needs assistance Equipment used: Rolling walker (2 wheels) Transfers: Sit to/from Stand, Bed to chair/wheelchair/BSC Sit to Stand: Min assist   Step pivot transfers: Min assist       General transfer comment: Cues for hand placement and power up through bil UE/LE. Min assist to steady with rise, pt with reduced posterior lean however flexed posture in standing and while stepping bed>chair. assist needed to direct and turn walker to guide steps to chair.    Ambulation/Gait                   Stairs             Wheelchair Mobility    Modified Rankin (Stroke Patients Only)       Balance Overall balance assessment: Needs assistance Sitting-balance support: Feet supported       Standing balance support: Bilateral upper extremity supported, During functional activity, Reliant on assistive device for balance Standing balance-Leahy Scale: Poor                              Cognition Arousal/Alertness: Awake/alert Behavior During Therapy: WFL for tasks assessed/performed Overall Cognitive Status: Within Functional Limits for tasks assessed                                          Exercises Total Joint Exercises Ankle Circles/Pumps:  AROM, Both, 15 reps    General Comments        Pertinent Vitals/Pain Pain Assessment Pain Assessment: Faces Faces Pain Scale: Hurts little more Pain Location: right knee Pain Descriptors / Indicators: Grimacing, Guarding, Sore Pain Intervention(s): Limited activity within patient's tolerance, Monitored during session, Repositioned    Home Living                          Prior Function            PT Goals (current goals can now be found in the care plan section) Acute Rehab PT Goals Patient Stated Goal: less pain PT Goal Formulation: With  patient Time For Goal Achievement: 09/16/22 Potential to Achieve Goals: Good Progress towards PT goals: Progressing toward goals    Frequency    7X/week      PT Plan Current plan remains appropriate    Co-evaluation              AM-PAC PT "6 Clicks" Mobility   Outcome Measure  Help needed turning from your back to your side while in a flat bed without using bedrails?: A Little Help needed moving from lying on your back to sitting on the side of a flat bed without using bedrails?: A Little Help needed moving to and from a bed to a chair (including a wheelchair)?: A Little Help needed standing up from a chair using your arms (e.g., wheelchair or bedside chair)?: A Little Help needed to walk in hospital room?: A Little Help needed climbing 3-5 steps with a railing? : A Lot 6 Click Score: 17    End of Session Equipment Utilized During Treatment: Gait belt Activity Tolerance: Patient tolerated treatment well Patient left: in bed;with call bell/phone within reach;with bed alarm set Nurse Communication: Mobility status PT Visit Diagnosis: Other abnormalities of gait and mobility (R26.89);Difficulty in walking, not elsewhere classified (R26.2)     Time: 3343-5686 PT Time Calculation (min) (ACUTE ONLY): 13 min  Charges:  $Therapeutic Activity: 8-22 mins                     Verner Mould, DPT Acute Rehabilitation Services Office [phone]  09/12/22 9:10 AM

## 2022-09-12 NOTE — TOC Transition Note (Signed)
Transition of Care [facility]) - CM/SW Discharge Note   Patient Details  Name: Tina Price MRN: [id number] Date of Birth: 1941/09/14  Transition of Care [facility]) CM/SW Contact:  Lennart Pall, LCSW Phone Number: 09/12/2022, 10:12 AM   Clinical Narrative:    Alerted by PA this morning that dc plan now changed to HHPT follow up.  Pt aware and agreeable and has no HH agency preference.  Referral placed with [facility] and info added to AVS.   Final next level of care: Home w Home Health Services Barriers to Discharge: No Barriers Identified   Patient Goals and CMS Choice Patient states their goals for this hospitalization and ongoing recovery are:: return home      Discharge Placement                       Discharge Plan and Services                DME Arranged: N/A         HH Arranged: PT HH Agency: [place]: [id number] Representative spoke with at [place]: [place] ([place]) Interventions     Readmission Risk Interventions     No data to display

## 2022-09-12 NOTE — Progress Notes (Signed)
Physical Therapy Treatment Patient Details Name: Tina Price MRN: [id number] DOB: 1941-01-26 Today's Date: 09/12/2022   History of Present Illness 81 yo female s/p R TKA on 09/09/22. PMH: colon CA, lumbar fusion, dysphonia, dementia, RCR, HTN    PT Comments    Patient is making slow but steady progress with mobility. Start of session pt requesting bathroom and Min assist required for transfer to [facility]. Pt continues to have posterior lean on standing and takes extra time to transition hands to RW. Assist needed for pericare and to maintain balance. Pt ambulated short bout of ~12' with min assist and RW. Daughter provided assist with cues and supervision for therapist during gait. Stair training continued and pt complete 2x4" step and 1x6" step. Step pattern is to ascend forward and descend backward leading up with good, and down with bad. Given significant generalized weakness and posterior lean reverse step down has been the safest option for the patient. EOS pt returned to bed to rest due to fatigue and ice applied for pain management. Will continue to benefit from skilled PT interventions to progress mobility for safe discharge home. Pt will require a youth RW for mobility and a BSC as pt will be confined to a single room with no bathroom. DME order placed.    Recommendations for follow up therapy are one component of a multi-disciplinary discharge planning process, led by the attending physician.  Recommendations may be updated based on patient status, additional functional criteria and insurance authorization.  Follow Up Recommendations  Follow physician's recommendations for discharge plan and follow up therapies     Assistance Recommended at Discharge Frequent or constant Supervision/Assistance  Patient can return home with the following A little help with walking and/or transfers;A little help with bathing/dressing/bathroom;Assist for transportation;Help with stairs or ramp for  entrance;Assistance with cooking/housework   Equipment Recommendations  Rolling walker (2 wheels);BSC/3in1 (youth RW)    Recommendations for Other Services       Precautions / Restrictions Precautions Precautions: Fall;Knee Restrictions Weight Bearing Restrictions: No RLE Weight Bearing: Weight bearing as tolerated     Mobility  Bed Mobility Overal bed mobility: Needs Assistance Bed Mobility: Supine to Sit, Sit to Supine     Supine to sit: HOB elevated, Min guard Sit to supine: Mod assist   General bed mobility comments: cues for use of belt to bring Rt LE off EOB, extra time but pt able to fully pivot and scoot with bed pad to place feet on floor with use of bil UE. EOS Mod assist provided to bring bil LE's onto bed.    Transfers Overall transfer level: Needs assistance Equipment used: Rolling walker (2 wheels) Transfers: Sit to/from Stand, Bed to chair/wheelchair/BSC Sit to Stand: Min assist   Step pivot transfers: Min assist       General transfer comment: Cues for hand placement and power up through bil UE/LE. Min assist to steady with rise, pt with continued posterior lean however and assist needed to weight shfit anteriorly while stepping bed>BSC. assist needed to direct and turn walker to guide steps to chair. Pt's daughter assisted wtih multiple sit<>stands from recliner during gait and stair training as pt required seated rest break. Pt's daughter also assisted with chair to bed step pivot transfer at EOS with cues and assist from therapist to guide pt and daughter through transfer.    Ambulation/Gait Ambulation/Gait assistance: Min assist Gait Distance (Feet): 12 Feet Assistive device: Rolling walker (2 wheels) Gait Pattern/deviations: Step-to pattern, Decreased stance  time - right, Decreased weight shift to right, Shuffle Gait velocity: decr     General Gait Details: pt with posterior lean at start, cues for anterio weight shift and to push down hard on RW  for step advancement. Assist to steady and to advance RW forward.   Stairs Stairs: Yes Stairs assistance: Mod assist, +2 safety/equipment Stair Management: Two rails, Step to pattern, Forwards Number of Stairs:  (2x4"; 1x6") General stair comments: pt compelted 2x4" steps wtih bil UE support with forward pattern to ascend and backward pattern to descend. Backwards descent likely the safest given pt's weakness and ongoing posterior lean. Pt also completed 1x6" step up and down with bil UE support. Daughter observed stair assitance provided by therapist.   Wheelchair Mobility    Modified Rankin (Stroke Patients Only)       Balance Overall balance assessment: Needs assistance Sitting-balance support: Feet supported       Standing balance support: Bilateral upper extremity supported, During functional activity, Reliant on assistive device for balance Standing balance-Leahy Scale: Poor                              Cognition Arousal/Alertness: Awake/alert Behavior During Therapy: WFL for tasks assessed/performed Overall Cognitive Status: Within Functional Limits for tasks assessed                                          Exercises Total Joint Exercises Ankle Circles/Pumps: AROM, Both, 15 reps    General Comments        Pertinent Vitals/Pain Pain Assessment Pain Assessment: Faces Faces Pain Scale: Hurts little more Pain Location: right knee Pain Descriptors / Indicators: Grimacing, Guarding, Sore Pain Intervention(s): Limited activity within patient's tolerance, Monitored during session, Repositioned, Ice applied    Home Living                          Prior Function            PT Goals (current goals can now be found in the care plan section) Acute Rehab PT Goals Patient Stated Goal: less pain PT Goal Formulation: With patient Time For Goal Achievement: 09/16/22 Potential to Achieve Goals: Good Progress towards PT goals:  Progressing toward goals    Frequency    7X/week      PT Plan Current plan remains appropriate    Co-evaluation              AM-PAC PT "6 Clicks" Mobility   Outcome Measure  Help needed turning from your back to your side while in a flat bed without using bedrails?: A Little Help needed moving from lying on your back to sitting on the side of a flat bed without using bedrails?: A Little Help needed moving to and from a bed to a chair (including a wheelchair)?: A Little Help needed standing up from a chair using your arms (e.g., wheelchair or bedside chair)?: A Little Help needed to walk in hospital room?: A Little Help needed climbing 3-5 steps with a railing? : A Lot 6 Click Score: 17    End of Session Equipment Utilized During Treatment: Gait belt Activity Tolerance: Patient tolerated treatment well Patient left: in bed;with call bell/phone within reach;with bed alarm set Nurse Communication: Mobility status PT Visit Diagnosis: Other abnormalities of gait  and mobility (R26.89);Difficulty in walking, not elsewhere classified (R26.2)     Time: 1421-1520 (10 minutes on BSC) PT Time Calculation (min) (ACUTE ONLY): 59 min  Charges:  $Gait Training: 23-37 mins $Therapeutic Activity: 8-22 mins                     Verner Mould, DPT Acute Rehabilitation Services Office [phone]  09/12/22 3:54 PM

## 2022-09-13 DIAGNOSIS — Z880 Allergy status to penicillin: Secondary | ICD-10-CM | POA: Diagnosis not present

## 2022-09-13 DIAGNOSIS — R490 Dysphonia: Secondary | ICD-10-CM | POA: Diagnosis present

## 2022-09-13 DIAGNOSIS — Z85828 Personal history of other malignant neoplasm of skin: Secondary | ICD-10-CM | POA: Diagnosis not present

## 2022-09-13 DIAGNOSIS — Z79899 Other long term (current) drug therapy: Secondary | ICD-10-CM | POA: Diagnosis not present

## 2022-09-13 DIAGNOSIS — M1711 Unilateral primary osteoarthritis, right knee: Secondary | ICD-10-CM | POA: Diagnosis present

## 2022-09-13 DIAGNOSIS — G8929 Other chronic pain: Secondary | ICD-10-CM | POA: Diagnosis present

## 2022-09-13 DIAGNOSIS — Z85038 Personal history of other malignant neoplasm of large intestine: Secondary | ICD-10-CM | POA: Diagnosis not present

## 2022-09-13 DIAGNOSIS — M25761 Osteophyte, right knee: Secondary | ICD-10-CM | POA: Diagnosis present

## 2022-09-13 DIAGNOSIS — Z9049 Acquired absence of other specified parts of digestive tract: Secondary | ICD-10-CM | POA: Diagnosis not present

## 2022-09-13 DIAGNOSIS — Z882 Allergy status to sulfonamides status: Secondary | ICD-10-CM | POA: Diagnosis not present

## 2022-09-13 DIAGNOSIS — E785 Hyperlipidemia, unspecified: Secondary | ICD-10-CM | POA: Diagnosis present

## 2022-09-13 DIAGNOSIS — Z981 Arthrodesis status: Secondary | ICD-10-CM | POA: Diagnosis not present

## 2022-09-13 DIAGNOSIS — I10 Essential (primary) hypertension: Secondary | ICD-10-CM | POA: Diagnosis present

## 2022-09-13 DIAGNOSIS — Z96651 Presence of right artificial knee joint: Secondary | ICD-10-CM | POA: Diagnosis present

## 2022-09-13 DIAGNOSIS — Z881 Allergy status to other antibiotic agents status: Secondary | ICD-10-CM | POA: Diagnosis not present

## 2022-09-13 DIAGNOSIS — F0390 Unspecified dementia without behavioral disturbance: Secondary | ICD-10-CM | POA: Diagnosis present

## 2022-09-13 DIAGNOSIS — Z87440 Personal history of urinary (tract) infections: Secondary | ICD-10-CM | POA: Diagnosis not present

## 2022-09-13 DIAGNOSIS — M549 Dorsalgia, unspecified: Secondary | ICD-10-CM | POA: Diagnosis present

## 2022-09-13 DIAGNOSIS — Z87891 Personal history of nicotine dependence: Secondary | ICD-10-CM | POA: Diagnosis not present

## 2022-09-13 NOTE — Progress Notes (Signed)
Physical Therapy Treatment Patient Details Name: Tina Price MRN: [id number] DOB: 06/27/1941 Today's Date: 09/13/2022   History of Present Illness 81 yo female s/p R TKA on 09/09/22. PMH: colon CA, lumbar fusion, dysphonia, dementia, RCR, HTN    PT Comments    Very sweet patient needs extra time for mobility. Began with patient supine exercises. Noticed pt with resting knee in external rotation with some knee flexion. Educated pt about long towel roll on lateral side of R leg to prevent external rotation when supine and to fold end of towel under achilles ( R ankle) to help promote knee extension. Pt was tight with knee extension even after AAROM for knee extension about 20 degrees from extension.   Worked on getting EOB, takes a lot of time and effort. Walking in room only 7 feet today, became very nauseated, sweaty and weak having to pull recliner behind pt to sit.    Pt states she feels very weak today, has not been eating great either. Pt is not safe to DC home today ( dtr is not here yet has very bad headache) Pt would nto be able to make it from the  car to the door and the stairs is a big obstacle for her as well. If she doesn't continue to progress, she may need ST-SNF .     Recommendations for follow up therapy are one component of a multi-disciplinary discharge planning process, led by the attending physician.  Recommendations may be updated based on patient status, additional functional criteria and insurance authorization.  Follow Up Recommendations  Follow physician's recommendations for discharge plan and follow up therapies     Assistance Recommended at Discharge Frequent or constant Supervision/Assistance  Patient can return home with the following A little help with walking and/or transfers;A little help with bathing/dressing/bathroom;Assist for transportation;Help with stairs or ramp for entrance;Assistance with cooking/housework   Equipment Recommendations   Rolling walker (2 wheels);BSC/3in1    Recommendations for Other Services       Precautions / Restrictions Precautions Precautions: Fall;Knee Restrictions Weight Bearing Restrictions: No RLE Weight Bearing: Weight bearing as tolerated     Mobility  Bed Mobility Overal bed mobility: Needs Assistance Bed Mobility: Supine to Sit, Sit to Supine     Supine to sit: HOB elevated, Min assist     General bed mobility comments: pt needs increased time to get to edge of bed. Requires a lot of effort and fatigues quickly. head of bed elevated and use of rail as well wtih min assist    Transfers Overall transfer level: Needs assistance Equipment used: Rolling walker (2 wheels) Transfers: Sit to/from Stand Sit to Stand: Mod assist           General transfer comment: cues for hand placement and min to Mod assist to boost from bed,. requires a lot of time to come to upright standing and R knee is still in flexed position.    Ambulation/Gait Ambulation/Gait assistance: Min assist Gait Distance (Feet): 6 Feet Assistive device: Rolling walker (2 wheels) Gait Pattern/deviations: Step-to pattern, Decreased stance time - right, Decreased weight shift to right, Shuffle Gait velocity: decr     General Gait Details: pt with flexed posture, small step to pattern, and R LE not very reliable ( still not able to do AROM , SLR or knee extension on her own)   Stairs             Wheelchair Mobility    Modified Rankin (Stroke Patients Only)  Balance Overall balance assessment: Needs assistance Sitting-balance support: Feet supported, Bilateral upper extremity supported Sitting balance-Leahy Scale: Good     Standing balance support: Bilateral upper extremity supported, During functional activity, Reliant on assistive device for balance Standing balance-Leahy Scale: Poor                              Cognition Arousal/Alertness: Awake/alert Behavior During  Therapy: WFL for tasks assessed/performed Overall Cognitive Status: Within Functional Limits for tasks assessed                                          Exercises Total Joint Exercises Ankle Circles/Pumps: Supine, Right, AROM, 10 reps Quad Sets: AROM, Supine, Right, 10 reps Heel Slides: AAROM, Supine, Right, 10 reps Straight Leg Raises: AAROM, Supine, Right, 10 reps Goniometric ROM: 20-60    General Comments        Pertinent Vitals/Pain Pain Assessment Pain Score: 4  Pain Location: right knee, pt states it is better than the last few days Pain Descriptors / Indicators: Grimacing, Guarding, Sore Pain Intervention(s): Monitored during session    Home Living                          Prior Function            PT Goals (current goals can now be found in the care plan section) Acute Rehab PT Goals Patient Stated Goal: less pain PT Goal Formulation: With patient Time For Goal Achievement: 09/16/22 Potential to Achieve Goals: Good Progress towards PT goals: Progressing toward goals    Frequency    7X/week      PT Plan Current plan remains appropriate    Co-evaluation              AM-PAC PT "6 Clicks" Mobility   Outcome Measure  Help needed turning from your back to your side while in a flat bed without using bedrails?: A Little Help needed moving from lying on your back to sitting on the side of a flat bed without using bedrails?: A Little Help needed moving to and from a bed to a chair (including a wheelchair)?: A Little Help needed standing up from a chair using your arms (e.g., wheelchair or bedside chair)?: A Little Help needed to walk in hospital room?: A Little Help needed climbing 3-5 steps with a railing? : A Little 6 Click Score: 18    End of Session Equipment Utilized During Treatment: Gait belt Activity Tolerance: Patient tolerated treatment well Patient left: in chair;with call bell/phone within reach Nurse  Communication: Mobility status PT Visit Diagnosis: Other abnormalities of gait and mobility (R26.89);Difficulty in walking, not elsewhere classified (R26.2)     Time: 1210-1252 PT Time Calculation (min) (ACUTE ONLY): 42 min  Charges:  $Gait Training: 8-22 mins $Therapeutic Exercise: 8-22 mins                     .Gatha Mayer, PT, MPT Acute Rehabilitation Services Office: [phone] If a weekend: [facility] w/e pager [phone] 09/13/2022   Clide Dales 09/13/2022, 1:09 PM

## 2022-09-13 NOTE — Plan of Care (Signed)
  Problem: Education: Goal: Knowledge of the prescribed therapeutic regimen will improve Outcome: Progressing   Problem: Activity: Goal: Ability to avoid complications of mobility impairment will improve Outcome: Progressing Goal: Range of joint motion will improve Outcome: Progressing   Problem: Clinical Measurements: Goal: Postoperative complications will be avoided or minimized Outcome: Progressing

## 2022-09-13 NOTE — Progress Notes (Signed)
Physical Therapy Treatment Patient Details Name: Tina Price MRN: [id number] DOB: 10/22/1940 Today's Date: 09/13/2022   History of Present Illness 81 yo female s/p R TKA on 09/09/22. PMH: colon CA, lumbar fusion, dysphonia, , RCR, HTN    PT Comments    P.m session. Pt was feeling slightly better, improved pain, and dtr present. Worked seated exercises and then ambulation in room. Still very limited with distance and needing assistance and major cueing for safety and balance. Fatigues very easily. Patient and family very concerned with ability to care for and progress pt in home environment.  They are trying to hire help however it is not nurse tech or skilled help to know and help and assist her appropriately. Will continue to work with her while here, however feel pt would benefit from ST-SNF to transition home safely.    Recommendations for follow up therapy are one component of a multi-disciplinary discharge planning process, led by the attending physician.  Recommendations may be updated based on patient status, additional functional criteria and insurance authorization.  Follow Up Recommendations  Follow physician's recommendations for discharge plan and follow up therapies     Assistance Recommended at Discharge Frequent or constant Supervision/Assistance  Patient can return home with the following A little help with walking and/or transfers;A little help with bathing/dressing/bathroom;Assist for transportation;Help with stairs or ramp for entrance;Assistance with cooking/housework   Equipment Recommendations  Rolling walker (2 wheels);BSC/3in1    Recommendations for Other Services       Precautions / Restrictions Precautions Precautions: Fall;Knee Restrictions Weight Bearing Restrictions: No RLE Weight Bearing: Weight bearing as tolerated     Mobility  Bed Mobility Overal bed mobility: Needs Assistance Bed Mobility: Supine to Sit, Sit to Supine     Supine to  sit: HOB elevated, Min assist     General bed mobility comments: pt needs increased time to get to edge of bed. Requires a lot of effort and fatigues quickly. head of bed elevated and use of rail as well wtih min assist    Transfers Overall transfer level: Needs assistance Equipment used: Rolling walker (2 wheels) Transfers: Sit to/from Stand Sit to Stand: Min assist   Step pivot transfers: Min assist       General transfer comment: cues for hand placement and min to Mod assist to boost from bed,. requires a lot of time to come to upright standing and R knee is still in flexed position. REquires 90 percent cueing during the entire task    Ambulation/Gait Ambulation/Gait assistance: Min assist Gait Distance (Feet): 10 Feet Assistive device: Rolling walker (2 wheels) Gait Pattern/deviations: Step-to pattern, Decreased stance time - right, Decreased weight shift to right, Shuffle Gait velocity: decr     General Gait Details: pt with flexed posture, small step to pattern, and R LE not very reliable ( still not able to do AROM , SLR or knee extension on her own). Takes a log time for a short distance and pt fatigues quickly. 90 % cueing for all sequencing and RW progressiona dn LE progression as well. When she gets too close to the  front of the RW lost of balance posteriorly.   Stairs             Wheelchair Mobility    Modified Rankin (Stroke Patients Only)       Balance Overall balance assessment: Needs assistance Sitting-balance support: Feet supported, Bilateral upper extremity supported Sitting balance-Leahy Scale: Good     Standing balance support: Bilateral  upper extremity supported, During functional activity, Reliant on assistive device for balance Standing balance-Leahy Scale: Poor                              Cognition Arousal/Alertness: Awake/alert Behavior During Therapy: WFL for tasks assessed/performed Overall Cognitive Status: Within  Functional Limits for tasks assessed                                          Exercises Total Joint Exercises Ankle Circles/Pumps: Supine, Right, AROM, 10 reps Quad Sets: AROM, Supine, Right, 10 reps Heel Slides: AAROM, Supine, Right, 10 reps Straight Leg Raises: AAROM, Supine, Right, 10 reps Long Arc Quad: AAROM, Right, 5 reps, Seated Knee Flexion: AAROM, Right, Seated, 5 reps Goniometric ROM: 15-90 seated. Extension improved since this monring , and patietns flexion sitting edge of recliner was good.    General Comments        Pertinent Vitals/Pain Pain Assessment Pain Score: 2  Pain Location: right knee, pt states it is better than the last few days Pain Descriptors / Indicators: Grimacing, Guarding, Sore Pain Intervention(s): Monitored during session, Repositioned, Ice applied    Home Living                          Prior Function            PT Goals (current goals can now be found in the care plan section) Acute Rehab PT Goals Patient Stated Goal: less pain PT Goal Formulation: With patient Time For Goal Achievement: 09/16/22 Potential to Achieve Goals: Good Progress towards PT goals: Progressing toward goals    Frequency    7X/week      PT Plan Current plan remains appropriate (recommend ST-SNF due to slow progression and not skilled help at home to assist pt safely and progress pt appropriately)    Co-evaluation              AM-PAC PT "6 Clicks" Mobility   Outcome Measure  Help needed turning from your back to your side while in a flat bed without using bedrails?: A Little Help needed moving from lying on your back to sitting on the side of a flat bed without using bedrails?: A Little Help needed moving to and from a bed to a chair (including a wheelchair)?: A Little Help needed standing up from a chair using your arms (e.g., wheelchair or bedside chair)?: A Little Help needed to walk in hospital room?: A Lot Help  needed climbing 3-5 steps with a railing? : A Lot 6 Click Score: 16    End of Session Equipment Utilized During Treatment: Gait belt Activity Tolerance: Patient tolerated treatment well Patient left: in chair;with call bell/phone within reach;with family/visitor present Nurse Communication: Mobility status PT Visit Diagnosis: Other abnormalities of gait and mobility (R26.89);Difficulty in walking, not elsewhere classified (R26.2)     Time: 1400-1440 PT Time Calculation (min) (ACUTE ONLY): 40 min  Charges:  $Gait Training: 8-22 mins $Therapeutic Exercise: 8-22 mins                     Gatha Mayer, PT, MPT Acute Rehabilitation Services Office: [phone] If a weekend: [facility] w/e pager [phone] 09/13/2022    Clide Dales 09/13/2022, 4:27 PM

## 2022-09-13 NOTE — Plan of Care (Signed)
  Problem: Pain Management: Goal: Pain level will decrease with appropriate interventions Outcome: Progressing   Problem: Coping: Goal: Level of anxiety will decrease Outcome: Progressing

## 2022-09-13 NOTE — Progress Notes (Signed)
   Subjective: 4 Days Post-Op Procedure(s) (LRB): TOTAL KNEE ARTHROPLASTY (Right)  Pt still limited with therapy and ambulation Pain is mild to moderate Denies any new symptoms or issues Otherwise doing fair Patient reports pain as moderate.  Objective:   VITALS:   Vitals:   09/12/22 2140 09/13/22 0556  BP: (!) 141/52 (!) 131/53  Pulse: 74 63  Resp: 17 18  Temp: 99 F (37.2 C) 98.3 F (36.8 C)  SpO2: 97% 96%    Right knee dressing intact, incision healing well Nv intact distally No rashes or edema distally  Limited quad strength  LABS Recent Labs    09/11/22 0345  HGB 10.3*  HCT 31.7*  WBC 9.6  PLT 198    No results for input(s): "NA", "K", "BUN", "CREATININE", "GLUCOSE" in the last 72 hours.   Assessment/Plan: 4 Days Post-Op Procedure(s) (LRB): TOTAL KNEE ARTHROPLASTY (Right) Continue PT/OT Will monitor her progress with therapy in regards to d/c Pain management as needed Pulmonary toilet     Brad Luna [place], [place] is now [employer] [facility] [address] [place], [place],  [address] Phone: [phone] [email or web address] Facebook  [employer]

## 2022-09-14 NOTE — TOC Progression Note (Signed)
Transition of Care [facility]) - Progression Note    Patient Details  Name: Tina Price MRN: [id number] Date of Birth: 1940-10-19  Transition of Care [facility]) CM/SW Contact  Laelynn Blizzard, Juliann Pulse, RN Phone Number: 09/14/2022, 1:37 PM  Clinical Narrative:Spoke to patient/Kristen(dtr) in rm about d/c plans-concerns about needing rehab. Cyril Mourning has set up 24/7 comfort keepers, patient home alone, has Kristen(dtr) primary contact & Carson(son). Has rw,3n1 for home already delivered to rm.[facility] already following. PT to see again.       Expected Discharge Plan:  (TBD) Barriers to Discharge: Continued Medical Work up  Expected Discharge Plan and Services Expected Discharge Plan:  (TBD)         Expected Discharge Date: 09/13/22               DME Arranged: N/A         HH Arranged: PT HH Agency: [place]: 0109 Representative spoke with at [place]: [place] Determinants of Health ([place]) Interventions    Readmission Risk Interventions     No data to display

## 2022-09-14 NOTE — Progress Notes (Signed)
   Subjective: 5 Days Post-Op Procedure(s) (LRB): TOTAL KNEE ARTHROPLASTY (Right)  Pt still c/o moderate pain in the right knee Therapy has been limited thus far Denies any new symptoms other than pain Otherwise doing fair Patient reports pain as moderate.  Objective:   VITALS:   Vitals:   09/13/22 1930 09/14/22 0423  BP: 137/61 (!) 126/56  Pulse: 74 69  Resp: 18 15  Temp: 99.4 F (37.4 C) 98 F (36.7 C)  SpO2: 99% 97%    Right knee: well healing incision to anterior knee Nv intact distally No rashes or edema distally  Guarded rom  LABS No results for input(s): "HGB", "HCT", "WBC", "PLT" in the last 72 hours.  No results for input(s): "NA", "K", "BUN", "CREATININE", "GLUCOSE" in the last 72 hours.   Assessment/Plan: 5 Days Post-Op Procedure(s) (LRB): TOTAL KNEE ARTHROPLASTY (Right) Continue PT/OT Pain management Will have patient and family discuss d/c options tomorrow   Merla Riches PA-C, [place] is now [employer] [facility] [address] [place], [place],  [address] Phone: [phone] [email or web address] Facebook  [employer]

## 2022-09-14 NOTE — Progress Notes (Signed)
Physical Therapy Treatment Patient Details Name: Tina Price MRN: [id number] DOB: 1940-12-07 Today's Date: 09/14/2022   History of Present Illness 81 yo female s/p R TKA on 09/09/22. PMH: colon CA, lumbar fusion, dysphonia, , RCR, HTN    PT Comments    Pt continues very cooperative but progressing slowly with mobility and requiring increased time and significant assist for safe performance of all basic mobility tasks.  This pm, pt assisted from chair to [facility] for toileting, to standing for hygiene largely performed by CNA, ambulated limited distance to bedside and assisted to bed to perform therex program with assist and multiple rest breaks. Pt could greatly benefit from alternate dc plan to SNF level rehab to maximize IND and safety prior to return home.    Recommendations for follow up therapy are one component of a multi-disciplinary discharge planning process, led by the attending physician.  Recommendations may be updated based on patient status, additional functional criteria and insurance authorization.  Follow Up Recommendations  Follow physician's recommendations for discharge plan and follow up therapies     Assistance Recommended at Discharge Frequent or constant Supervision/Assistance  Patient can return home with the following A little help with walking and/or transfers;A little help with bathing/dressing/bathroom;Assist for transportation;Help with stairs or ramp for entrance;Assistance with cooking/housework   Equipment Recommendations  Rolling walker (2 wheels);BSC/3in1    Recommendations for Other Services       Precautions / Restrictions Precautions Precautions: Fall;Knee Restrictions Weight Bearing Restrictions: No RLE Weight Bearing: Weight bearing as tolerated     Mobility  Bed Mobility Overal bed mobility: Needs Assistance Bed Mobility: Sit to Supine     Supine to sit: Mod assist Sit to supine: Mod assist   General bed mobility comments:  Increased time with cues for sequence and assist to manage bil LEs onto bed    Transfers Overall transfer level: Needs assistance Equipment used: Rolling walker (2 wheels) Transfers: Bed to chair/wheelchair/BSC, Sit to/from Stand Sit to Stand: Min assist, Mod assist   Step pivot transfers: Min assist       General transfer comment: cues for LE management and use of UEs to self assist. Physical assist to bring wt up and fwd and to balance in standing    Ambulation/Gait Ambulation/Gait assistance: Min assist Gait Distance (Feet): 6 Feet Assistive device: Rolling walker (2 wheels) Gait Pattern/deviations: Step-to pattern, Decreased stance time - right, Decreased weight shift to right, Shuffle Gait velocity: decr     General Gait Details: pt with flexed posture, small step to pattern, and R LE not very reliable ( still not able to do AROM , SLR or knee extension on her own). Cueing for all sequencing, posture, and position from RW.   Stairs             Wheelchair Mobility    Modified Rankin (Stroke Patients Only)       Balance Overall balance assessment: Needs assistance Sitting-balance support: Feet supported, Bilateral upper extremity supported Sitting balance-Leahy Scale: Good     Standing balance support: Bilateral upper extremity supported, During functional activity, Reliant on assistive device for balance Standing balance-Leahy Scale: Poor                              Cognition Arousal/Alertness: Awake/alert Behavior During Therapy: WFL for tasks assessed/performed Overall Cognitive Status: Impaired/Different from baseline Area of Impairment: Problem solving  Problem Solving: Slow processing General Comments: Dtr in room and notes slower processing and following cues        Exercises Total Joint Exercises Ankle Circles/Pumps: Supine, Right, AROM, 20 reps Quad Sets: AROM, Supine, Right, 10  reps Heel Slides: AAROM, Supine, Right, 10 reps Straight Leg Raises: AAROM, Supine, Right, 10 reps Goniometric ROM: -12 - 75 supine    General Comments        Pertinent Vitals/Pain Pain Assessment Pain Assessment: Faces Faces Pain Scale: Hurts even more Pain Location: R knee pain Pain Descriptors / Indicators: Grimacing, Guarding, Sore Pain Intervention(s): Limited activity within patient's tolerance, Monitored during session, Premedicated before session (pt declines ice - states makes it ache worse)    Home Living                          Prior Function            PT Goals (current goals can now be found in the care plan section) Acute Rehab PT Goals Patient Stated Goal: less pain PT Goal Formulation: With patient Time For Goal Achievement: 09/16/22 Potential to Achieve Goals: Good Progress towards PT goals: Progressing toward goals    Frequency    7X/week      PT Plan Current plan remains appropriate    Co-evaluation              AM-PAC PT "6 Clicks" Mobility   Outcome Measure  Help needed turning from your back to your side while in a flat bed without using bedrails?: A Little Help needed moving from lying on your back to sitting on the side of a flat bed without using bedrails?: A Lot Help needed moving to and from a bed to a chair (including a wheelchair)?: A Little Help needed standing up from a chair using your arms (e.g., wheelchair or bedside chair)?: A Lot Help needed to walk in hospital room?: A Lot Help needed climbing 3-5 steps with a railing? : A Lot 6 Click Score: 14    End of Session Equipment Utilized During Treatment: Gait belt Activity Tolerance: Patient limited by fatigue;Patient limited by pain Patient left: in bed;with call bell/phone within reach;with bed alarm set;with family/visitor present Nurse Communication: Mobility status PT Visit Diagnosis: Other abnormalities of gait and mobility (R26.89);Difficulty in  walking, not elsewhere classified (R26.2)     Time: 1245-8099 PT Time Calculation (min) (ACUTE ONLY): 45 min  Charges:  $Gait Training: 8-22 mins $Therapeutic Exercise: 8-22 mins $Therapeutic Activity: 8-22 mins                     Debe Coder PT Acute Rehabilitation Services Pager [phone] Office [phone]    Tina Price 09/14/2022, 4:20 PM

## 2022-09-14 NOTE — Plan of Care (Signed)
Problem: Education: Goal: Knowledge of the prescribed therapeutic regimen will improve 09/14/2022 0807 by Jonn Shingles, LPN Outcome: Progressing 09/14/2022 0806 by Jonn Shingles, LPN Outcome: Progressing   Problem: Activity: Goal: Ability to avoid complications of mobility impairment will improve 09/14/2022 0807 by Jonn Shingles, LPN Outcome: Progressing 09/14/2022 0806 by Jonn Shingles, LPN Outcome: Progressing Goal: Range of joint motion will improve 09/14/2022 0807 by Jonn Shingles, LPN Outcome: Progressing 09/14/2022 0806 by Jonn Shingles, LPN Outcome: Progressing   Problem: Clinical Measurements: Goal: Postoperative complications will be avoided or minimized 09/14/2022 0807 by Jonn Shingles, LPN Outcome: Progressing 09/14/2022 0806 by Jonn Shingles, LPN Outcome: Progressing   Problem: Pain Management: Goal: Pain level will decrease with appropriate interventions 09/14/2022 0807 by Jonn Shingles, LPN Outcome: Progressing 09/14/2022 0806 by Jonn Shingles, LPN Outcome: Progressing   Problem: Skin Integrity: Goal: Will show signs of wound healing 09/14/2022 0807 by Jonn Shingles, LPN Outcome: Progressing 09/14/2022 0806 by Jonn Shingles, LPN Outcome: Progressing   Problem: Education: Goal: Knowledge of General Education information will improve Description: Including pain rating scale, medication(s)/side effects and non-pharmacologic comfort measures Outcome: Progressing   Problem: Health Behavior/Discharge Planning: Goal: Ability to manage health-related needs will improve Outcome: Progressing   Problem: Clinical Measurements: Goal: Ability to maintain clinical measurements within normal limits will improve Outcome: Progressing Goal: Will remain free from infection Outcome: Progressing Goal: Diagnostic test results will improve Outcome: Progressing Goal: Respiratory complications will improve Outcome: Progressing Goal: Cardiovascular  complication will be avoided Outcome: Progressing   Problem: Activity: Goal: Risk for activity intolerance will decrease Outcome: Progressing   Problem: Coping: Goal: Level of anxiety will decrease Outcome: Progressing   Problem: Elimination: Goal: Will not experience complications related to bowel motility Outcome: Progressing Goal: Will not experience complications related to urinary retention Outcome: Progressing   Problem: Pain Managment: Goal: General experience of comfort will improve Outcome: Progressing   Problem: Safety: Goal: Ability to remain free from injury will improve Outcome: Progressing   Problem: Skin Integrity: Goal: Risk for impaired skin integrity will decrease Outcome: Progressing   Problem: Education: Goal: Knowledge of the prescribed therapeutic regimen will improve Outcome: Progressing   Problem: Activity: Goal: Ability to avoid complications of mobility impairment will improve Outcome: Progressing Goal: Range of joint motion will improve Outcome: Progressing   Problem: Clinical Measurements: Goal: Postoperative complications will be avoided or minimized Outcome: Progressing   Problem: Pain Management: Goal: Pain level will decrease with appropriate interventions Outcome: Progressing   Problem: Skin Integrity: Goal: Will show signs of wound healing Outcome: Progressing   Problem: Education: Goal: Knowledge of General Education information will improve Description: Including pain rating scale, medication(s)/side effects and non-pharmacologic comfort measures Outcome: Progressing   Problem: Health Behavior/Discharge Planning: Goal: Ability to manage health-related needs will improve Outcome: Progressing   Problem: Clinical Measurements: Goal: Ability to maintain clinical measurements within normal limits will improve Outcome: Progressing Goal: Will remain free from infection Outcome: Progressing Goal: Diagnostic test results will  improve Outcome: Progressing Goal: Respiratory complications will improve Outcome: Progressing Goal: Cardiovascular complication will be avoided Outcome: Progressing   Problem: Activity: Goal: Risk for activity intolerance will decrease Outcome: Progressing   Problem: Nutrition: Goal: Adequate nutrition will be maintained Outcome: Progressing   Problem: Coping: Goal: Level of anxiety will decrease Outcome: Progressing   Problem: Elimination: Goal: Will not experience complications related to bowel motility Outcome: Progressing Goal: Will not experience complications related to urinary retention Outcome: Progressing   Problem: Pain Managment: Goal: General experience of comfort  will improve Outcome: Progressing   Problem: Safety: Goal: Ability to remain free from injury will improve Outcome: Progressing   Problem: Skin Integrity: Goal: Risk for impaired skin integrity will decrease Outcome: Progressing

## 2022-09-14 NOTE — Progress Notes (Signed)
Physical Therapy Treatment Patient Details Name: Tina Price MRN: [id number] DOB: 1941/01/31 Today's Date: 09/14/2022   History of Present Illness 81 yo female s/p R TKA on 09/09/22. PMH: colon CA, lumbar fusion, dysphonia, , RCR, HTN    PT Comments    Pt very cooperative this am but progressing slowly with all aspects of recovery.  Pt performed partial therex program but discontinues with onset of sciatic pain and pt request to change position for pain relief.  Pt assisted to EOB and to standing with noted decreased sciatic pain and able to ambulate ~4' before c/o increasing nausea and requesting to sit.  Pt positioned in recliner for comfort and RN alerted to request for nausea meds.   Recommendations for follow up therapy are one component of a multi-disciplinary discharge planning process, led by the attending physician.  Recommendations may be updated based on patient status, additional functional criteria and insurance authorization.  Follow Up Recommendations  Follow physician's recommendations for discharge plan and follow up therapies     Assistance Recommended at Discharge Frequent or constant Supervision/Assistance  Patient can return home with the following A little help with walking and/or transfers;A little help with bathing/dressing/bathroom;Assist for transportation;Help with stairs or ramp for entrance;Assistance with cooking/housework   Equipment Recommendations  Rolling walker (2 wheels);BSC/3in1    Recommendations for Other Services       Precautions / Restrictions Precautions Precautions: Fall;Knee Restrictions Weight Bearing Restrictions: No RLE Weight Bearing: Weight bearing as tolerated     Mobility  Bed Mobility Overal bed mobility: Needs Assistance Bed Mobility: Supine to Sit     Supine to sit: Mod assist     General bed mobility comments: INcreased time with assist to bring trunk to upright and complete rotation to EOB sitting     Transfers Overall transfer level: Needs assistance Equipment used: Rolling walker (2 wheels) Transfers: Sit to/from Stand Sit to Stand: Min assist           General transfer comment: cues for hand placement and min to Mod assist to boost from bed,. requires a lot of time to come to upright standing and R knee is still in flexed position.    Ambulation/Gait Ambulation/Gait assistance: Min assist Gait Distance (Feet): 4 Feet Assistive device: Rolling walker (2 wheels) Gait Pattern/deviations: Step-to pattern, Decreased stance time - right, Decreased weight shift to right, Shuffle Gait velocity: decr     General Gait Details: pt with flexed posture, small step to pattern, and R LE not very reliable ( still not able to do AROM , SLR or knee extension on her own). Cueing for all sequencing, posture, and position from RW.   Stairs             Wheelchair Mobility    Modified Rankin (Stroke Patients Only)       Balance Overall balance assessment: Needs assistance Sitting-balance support: Feet supported, Bilateral upper extremity supported Sitting balance-Leahy Scale: Good     Standing balance support: Bilateral upper extremity supported, During functional activity, Reliant on assistive device for balance Standing balance-Leahy Scale: Poor                              Cognition Arousal/Alertness: Awake/alert Behavior During Therapy: WFL for tasks assessed/performed Overall Cognitive Status: Within Functional Limits for tasks assessed  Exercises Total Joint Exercises Ankle Circles/Pumps: Supine, Right, AROM, 20 reps Quad Sets: AROM, Supine, Right, 10 reps    General Comments        Pertinent Vitals/Pain Pain Assessment Pain Assessment: Faces Faces Pain Scale: Hurts whole lot Pain Location: ankle pain related to sciatica; 4/10 knee pain Pain Descriptors / Indicators: Grimacing,  Guarding, Sore Pain Intervention(s): Limited activity within patient's tolerance, Monitored during session, Premedicated before session    Home Living                          Prior Function            PT Goals (current goals can now be found in the care plan section) Acute Rehab PT Goals Patient Stated Goal: less pain PT Goal Formulation: With patient Time For Goal Achievement: 09/16/22 Potential to Achieve Goals: Good Progress towards PT goals: Progressing toward goals;Not progressing toward goals - comment (limited by pain, nausea)    Frequency    7X/week      PT Plan Current plan remains appropriate    Co-evaluation              AM-PAC PT "6 Clicks" Mobility   Outcome Measure  Help needed turning from your back to your side while in a flat bed without using bedrails?: A Little Help needed moving from lying on your back to sitting on the side of a flat bed without using bedrails?: A Lot Help needed moving to and from a bed to a chair (including a wheelchair)?: A Little Help needed standing up from a chair using your arms (e.g., wheelchair or bedside chair)?: A Little Help needed to walk in hospital room?: A Lot Help needed climbing 3-5 steps with a railing? : A Lot 6 Click Score: 15    End of Session Equipment Utilized During Treatment: Gait belt Activity Tolerance: Patient limited by fatigue;Patient limited by pain;Other (comment) (nausea) Patient left: in chair;with call bell/phone within reach Nurse Communication: Mobility status PT Visit Diagnosis: Other abnormalities of gait and mobility (R26.89);Difficulty in walking, not elsewhere classified (R26.2)     Time: 8088-1103 PT Time Calculation (min) (ACUTE ONLY): 33 min  Charges:  $Gait Training: 8-22 mins $Therapeutic Activity: 8-22 mins                     Debe Coder PT Acute Rehabilitation Services Pager [phone] Office [phone]    Tina Price 09/14/2022, 9:34  AM

## 2022-09-14 NOTE — Plan of Care (Signed)
  Problem: Pain Management: Goal: Pain level will decrease with appropriate interventions Outcome: Progressing   

## 2022-09-15 LAB — CBC
HCT: 31.3 % — ABNORMAL LOW (ref 36.0–46.0)
Hemoglobin: 9.9 g/dL — ABNORMAL LOW (ref 12.0–15.0)
MCH: 31.6 pg (ref 26.0–34.0)
MCHC: 31.6 g/dL (ref 30.0–36.0)
MCV: 100 fL (ref 80.0–100.0)
Platelets: 233 10*3/uL (ref 150–400)
RBC: 3.13 MIL/uL — ABNORMAL LOW (ref 3.87–5.11)
RDW: 13.7 % (ref 11.5–15.5)
WBC: 5.7 10*3/uL (ref 4.0–10.5)
nRBC: 0 % (ref 0.0–0.2)

## 2022-09-15 LAB — COMPREHENSIVE METABOLIC PANEL
ALT: 12 U/L (ref 0–44)
AST: 12 U/L — ABNORMAL LOW (ref 15–41)
Albumin: 2.9 g/dL — ABNORMAL LOW (ref 3.5–5.0)
Alkaline Phosphatase: 55 U/L (ref 38–126)
Anion gap: 6 (ref 5–15)
BUN: 29 mg/dL — ABNORMAL HIGH (ref 8–23)
CO2: 28 mmol/L (ref 22–32)
Calcium: 8.8 mg/dL — ABNORMAL LOW (ref 8.9–10.3)
Chloride: 101 mmol/L (ref 98–111)
Creatinine, Ser: 1.06 mg/dL — ABNORMAL HIGH (ref 0.44–1.00)
GFR, Estimated: 53 mL/min — ABNORMAL LOW (ref >60)
Glucose, Bld: 109 mg/dL — ABNORMAL HIGH (ref 70–99)
Potassium: 3.7 mmol/L (ref 3.5–5.1)
Sodium: 135 mmol/L (ref 135–145)
Total Bilirubin: 0.7 mg/dL (ref 0.3–1.2)
Total Protein: 5.8 g/dL — ABNORMAL LOW (ref 6.5–8.1)

## 2022-09-15 MED ORDER — OXYCODONE HCL 5 MG PO TABS: 10.0000 mg | ORAL_TABLET | ORAL | Status: DC | PRN

## 2022-09-15 MED ORDER — ACETAMINOPHEN 500 MG PO TABS
1000.0000 mg | ORAL_TABLET | Freq: Four times a day (QID) | ORAL | Status: DC
  Administered 2022-09-15 – 2022-09-17 (×6): 1000 mg via ORAL
  Filled 2022-09-15 (×6): qty 2

## 2022-09-15 MED ORDER — HYDROMORPHONE HCL 2 MG PO TABS
1.0000 mg | ORAL_TABLET | ORAL | Status: DC | PRN
  Administered 2022-09-15 – 2022-09-16 (×3): 2 mg via ORAL
  Filled 2022-09-15 (×3): qty 1

## 2022-09-15 MED ORDER — OXYCODONE HCL 5 MG PO TABS: 5.0000 mg | ORAL_TABLET | ORAL | Status: DC | PRN

## 2022-09-15 MED ORDER — SODIUM CHLORIDE 0.9 % IV BOLUS
250.0000 mL | Freq: Once | INTRAVENOUS | Status: AC
  Administered 2022-09-15: 250 mL via INTRAVENOUS

## 2022-09-15 NOTE — Progress Notes (Signed)
Physical Therapy Treatment Patient Details Name: Tina Price MRN: [id number] DOB: 03/09/1941 Today's Date: 09/15/2022   History of Present Illness 81 yo female s/p R TKA on 09/09/22. PMH: colon CA, lumbar fusion, dysphonia, , RCR, HTN    PT Comments    Pt is progressing this session, overall requiring min assist to min/guard. She requires incr time to complete tasks however this session is at a level that family/caregivers should be able to manage her at home. HHPT can assist in training caregivers. She is amb a very short household distance and may need a w/c or transport chair for home.    Recommendations for follow up therapy are one component of a multi-disciplinary discharge planning process, led by the attending physician.  Recommendations may be updated based on patient status, additional functional criteria and insurance authorization.  Follow Up Recommendations  Follow physician's recommendations for discharge plan and follow up therapies     Assistance Recommended at Discharge Frequent or constant Supervision/Assistance  Patient can return home with the following A little help with walking and/or transfers;A little help with bathing/dressing/bathroom;Assist for transportation;Help with stairs or ramp for entrance;Assistance with cooking/housework   Equipment Recommendations  Rolling walker (2 wheels);BSC/3in1 (youth RW)    Recommendations for Other Services       Precautions / Restrictions Precautions Precautions: Fall;Knee Precaution Comments: reviewed terminal knee extension/RLE hip in neutral Restrictions Weight Bearing Restrictions: No RLE Weight Bearing: Weight bearing as tolerated     Mobility  Bed Mobility Overal bed mobility: Needs Assistance Bed Mobility: Supine to Sit, Sit to Supine     Supine to sit: Min guard Sit to supine: Min guard, Min assist   General bed mobility comments: Increased time with cues for sequence. pt able to self assist  RLE off bed with gait belt, light assist to progress bil LEs on to bed with incr time and effort    Transfers Overall transfer level: Needs assistance Equipment used: Rolling walker (2 wheels) Transfers: Sit to/from Stand Sit to Stand: Min assist, Min guard           General transfer comment: cues for LE management and use of UEs to self assist. Physical assist to bring wt up and fwd and to balance in standing    Ambulation/Gait Ambulation/Gait assistance: Min assist, Min guard Gait Distance (Feet): 25 Feet Assistive device: Rolling walker (2 wheels) Gait Pattern/deviations: Step-to pattern, Decreased stance time - right, Decreased weight shift to right Gait velocity: decr     General Gait Details: cues for sequence, initial assist to progress RW forward. overall min/guard for balance and safety   Stairs             Wheelchair Mobility    Modified Rankin (Stroke Patients Only)       Balance     Sitting balance-Leahy Scale: Good     Standing balance support: Bilateral upper extremity supported, During functional activity, Reliant on assistive device for balance Standing balance-Leahy Scale: Poor                              Cognition Arousal/Alertness: Awake/alert Behavior During Therapy: WFL for tasks assessed/performed Overall Cognitive Status: Impaired/Different from baseline                               Problem Solving: Slow processing, Requires verbal cues  Exercises      General Comments        Pertinent Vitals/Pain Pain Assessment Pain Assessment: 0-10 Pain Score: 4  Pain Location: R knee pain Pain Descriptors / Indicators: Grimacing, Guarding, Sore Pain Intervention(s): Limited activity within patient's tolerance, Monitored during session, Premedicated before session, Other (comment) (declines ice)    Home Living                          Prior Function            PT Goals (current  goals can now be found in the care plan section) Acute Rehab PT Goals Patient Stated Goal: less pain PT Goal Formulation: With patient Time For Goal Achievement: 09/16/22 Potential to Achieve Goals: Good Progress towards PT goals: Progressing toward goals    Frequency    7X/week      PT Plan Current plan remains appropriate    Co-evaluation              AM-PAC PT "6 Clicks" Mobility   Outcome Measure  Help needed turning from your back to your side while in a flat bed without using bedrails?: A Little Help needed moving from lying on your back to sitting on the side of a flat bed without using bedrails?: A Little Help needed moving to and from a bed to a chair (including a wheelchair)?: A Little Help needed standing up from a chair using your arms (e.g., wheelchair or bedside chair)?: A Little Help needed to walk in hospital room?: A Little Help needed climbing 3-5 steps with a railing? : A Lot 6 Click Score: 17    End of Session Equipment Utilized During Treatment: Gait belt Activity Tolerance: Patient tolerated treatment well;Patient limited by fatigue Patient left: in bed;with call bell/phone within reach;with bed alarm set;with family/visitor present Nurse Communication: Mobility status PT Visit Diagnosis: Other abnormalities of gait and mobility (R26.89);Difficulty in walking, not elsewhere classified (R26.2)     Time: 1779-3903 PT Time Calculation (min) (ACUTE ONLY): 24 min  Charges:  $Gait Training: 8-22 mins $Therapeutic Activity: 8-22 mins                     Baxter Flattery, PT  Acute Rehab Dept [facility]) [phone]  [facility] Weekend Pager [facility] only)  [phone]  09/15/2022    [facility] 09/15/2022, 10:33 AM

## 2022-09-15 NOTE — Plan of Care (Signed)
  Problem: Coping: Goal: Level of anxiety will decrease Outcome: Progressing   Problem: Pain Managment: Goal: General experience of comfort will improve Outcome: Progressing   

## 2022-09-15 NOTE — Progress Notes (Signed)
   Subjective: 6 Days Post-Op Procedure(s) (LRB): TOTAL KNEE ARTHROPLASTY (Right) Patient reports pain as mild.   Patient seen in rounds for Dr. Alvan Dame. Patient is well, and has had no acute complaints or problems. No acute events overnight. +BM, voiding without difficulty.  I had a discussion with her daughter this morning. The patient is having very slow progression with PT, and there are concerns about her ability to function at home. She does have some family  help as well as CNA at home for some assistance.  We will continue therapy today.   Objective: Vital signs in last 24 hours: Temp:  [98 F (36.7 C)-98.9 F (37.2 C)] 98.8 F (37.1 C) (12/04 0618) Pulse Rate:  [63-79] 63 (12/04 0618) Resp:  [14-16] 16 (12/04 0618) BP: (113-139)/(56-99) 134/56 (12/04 0618) SpO2:  [91 %-100 %] 97 % (12/04 0618)  Intake/Output from previous day:  Intake/Output Summary (Last 24 hours) at 09/15/2022 0739 Last data filed at 09/15/2022 0200 Gross per 24 hour  Intake 1080 ml  Output 375 ml  Net 705 ml     Intake/Output this shift: No intake/output data recorded.  Labs: No results for input(s): "HGB" in the last 72 hours. No results for input(s): "WBC", "RBC", "HCT", "PLT" in the last 72 hours. No results for input(s): "NA", "K", "CL", "CO2", "BUN", "CREATININE", "GLUCOSE", "CALCIUM" in the last 72 hours. No results for input(s): "LABPT", "INR" in the last 72 hours.  Exam: General - Patient is Alert and Oriented Extremity - Neurologically intact Sensation intact distally Intact pulses distally Dorsiflexion/Plantar flexion intact Dressing - dressing C/D/I Motor Function - intact, moving foot and toes well on exam.   Past Medical History:  Diagnosis Date   Arthritis    Back pain, chronic    Cancer (HCC)    skin   Chronic UTI    Dementia (HCC)    Dysphonia    Headache    Hypertension     Assessment/Plan: 6 Days Post-Op Procedure(s) (LRB): TOTAL KNEE ARTHROPLASTY  (Right) Principal Problem:   S/P total knee arthroplasty, right  Estimated body mass index is 27.56 kg/m as calculated from the following:   Height as of this encounter: 5' (1.524 m).   Weight as of this encounter: 64 kg. Advance diet Up with therapy    DVT Prophylaxis - Aspirin Weight bearing as tolerated.  Will change her pain meds today. She last received a dose of oxycodone 15 mg which is a bit too strong for this point out from surgery.  Will try hydromorphone at a low dose for benefit of her nausea. Scheduled tylenol.  Up with PT again today to see if any progression. We would prefer d/c home rather than to SNF, but will re group tomorrow about this.  Griffith Citron, PA-C Orthopedic Surgery [phone] 09/15/2022, 7:39 AM

## 2022-09-15 NOTE — Progress Notes (Signed)
Patient sleeping soundly, no acute distress noted, respirations easy and unlabored, will continue to monitor.

## 2022-09-15 NOTE — Progress Notes (Signed)
Physical Therapy Treatment Patient Details Name: Tina Price MRN: [id number] DOB: 08-20-1941 Today's Date: 09/15/2022   History of Present Illness 81 yo female s/p R TKA on 09/09/22. PMH: colon CA, lumbar fusion, dysphonia, , RCR, HTN    PT Comments    Continues to improve this pm, incr amb distance (35' with min/guard assist).  Discussed with pt and dtr-in-law that pt was taking Gabapentin for her back/"nerve" pain prior to surgery; family checking on dosage and RN made aware.  Plan remains for home at this time with caregivers and family assist. Will work on stairs tomorrow.  Recommendations for follow up therapy are one component of a multi-disciplinary discharge planning process, led by the attending physician.  Recommendations may be updated based on patient status, additional functional criteria and insurance authorization.  Follow Up Recommendations  Follow physician's recommendations for discharge plan and follow up therapies     Assistance Recommended at Discharge Frequent or constant Supervision/Assistance  Patient can return home with the following A little help with walking and/or transfers;A little help with bathing/dressing/bathroom;Assist for transportation;Help with stairs or ramp for entrance;Assistance with cooking/housework   Equipment Recommendations  Rolling walker (2 wheels);BSC/3in1 (youth RW)    Recommendations for Other Services       Precautions / Restrictions Precautions Precautions: Fall;Knee Precaution Comments: reviewed terminal knee extension/RLE hip in neutral Restrictions Weight Bearing Restrictions: No RLE Weight Bearing: Weight bearing as tolerated     Mobility  Bed Mobility Overal bed mobility: Needs Assistance Bed Mobility: Supine to Sit     Supine to sit: Min guard Sit to supine: Min guard, Min assist   General bed mobility comments: Increased time with cues for sequence. pt able to self assist RLE off bed with gait belt     Transfers Overall transfer level: Needs assistance Equipment used: Rolling walker (2 wheels) Transfers: Sit to/from Stand Sit to Stand: Min guard           General transfer comment: cues for LE management and use of UEs to self assist.    Ambulation/Gait Ambulation/Gait assistance: Min guard Gait Distance (Feet): 35 Feet Assistive device: Rolling walker (2 wheels) Gait Pattern/deviations: Step-to pattern, Decreased stance time - right, Decreased weight shift to right Gait velocity: decr     General Gait Details: cues for sequence, and RW position. overall min/guard for balance and safety   Stairs             Wheelchair Mobility    Modified Rankin (Stroke Patients Only)       Balance     Sitting balance-Leahy Scale: Good     Standing balance support: Bilateral upper extremity supported, During functional activity, Reliant on assistive device for balance Standing balance-Leahy Scale: Poor                              Cognition Arousal/Alertness: Awake/alert Behavior During Therapy: WFL for tasks assessed/performed Overall Cognitive Status: Impaired/Different from baseline                               Problem Solving: Slow processing, Requires verbal cues General Comments: initiation and response time improving        Exercises Total Joint Exercises Ankle Circles/Pumps: Supine, Right, AROM, 20 reps Quad Sets: AROM, Supine, Right, 10 reps Straight Leg Raises: AAROM, Supine, Right, 10 reps Goniometric ROM: ~10 to 80 degrees knee flexion  General Comments        Pertinent Vitals/Pain Pain Assessment Pain Assessment: 0-10 Pain Score: 4  Pain Location: R knee pain Pain Descriptors / Indicators: Grimacing, Guarding, Sore Pain Intervention(s): Limited activity within patient's tolerance, Monitored during session, Premedicated before session, Repositioned    Home Living                          Prior  Function            PT Goals (current goals can now be found in the care plan section) Acute Rehab PT Goals Patient Stated Goal: less pain PT Goal Formulation: With patient Time For Goal Achievement: 09/16/22 Potential to Achieve Goals: Good Progress towards PT goals: Progressing toward goals    Frequency    7X/week      PT Plan Current plan remains appropriate    Co-evaluation              AM-PAC PT "6 Clicks" Mobility   Outcome Measure  Help needed turning from your back to your side while in a flat bed without using bedrails?: A Little Help needed moving from lying on your back to sitting on the side of a flat bed without using bedrails?: A Little Help needed moving to and from a bed to a chair (including a wheelchair)?: A Little Help needed standing up from a chair using your arms (e.g., wheelchair or bedside chair)?: A Little Help needed to walk in hospital room?: A Little Help needed climbing 3-5 steps with a railing? : A Lot 6 Click Score: 17    End of Session Equipment Utilized During Treatment: Gait belt Activity Tolerance: Patient tolerated treatment well;Patient limited by fatigue Patient left: with call bell/phone within reach;with family/visitor present;in chair;with chair alarm set Nurse Communication: Mobility status PT Visit Diagnosis: Other abnormalities of gait and mobility (R26.89);Difficulty in walking, not elsewhere classified (R26.2)     Time: 8242-3536 PT Time Calculation (min) (ACUTE ONLY): 34 min  Charges:  $Gait Training: 8-22 mins $Therapeutic Exercise: 8-22 mins                     Baxter Flattery, PT  Acute Rehab Dept [facility]) [phone]  [facility] Weekend Pager [facility] only)  [phone]  09/15/2022    [facility] 09/15/2022, 3:16 PM

## 2022-09-16 MED ORDER — PREGABALIN 25 MG PO CAPS
25.0000 mg | ORAL_CAPSULE | Freq: Two times a day (BID) | ORAL | Status: DC
  Administered 2022-09-16 – 2022-09-17 (×3): 25 mg via ORAL
  Filled 2022-09-16 (×3): qty 1

## 2022-09-16 NOTE — Plan of Care (Signed)
  Problem: Activity: Goal: Ability to avoid complications of mobility impairment will improve Outcome: Progressing   Problem: Pain Management: Goal: Pain level will decrease with appropriate interventions Outcome: Progressing   Problem: Activity: Goal: Ability to avoid complications of mobility impairment will improve Outcome: Progressing

## 2022-09-16 NOTE — Progress Notes (Signed)
Physical Therapy Treatment Patient Details Name: Tina Price MRN: [id number] DOB: June 27, 1941 Today's Date: 09/16/2022   History of Present Illness 81 yo female s/p R TKA on 09/09/22. PMH: colon CA, lumbar fusion, dysphonia, , RCR, HTN    PT Comments    Progressing toward goals, will attempt to review stairs this pm to prepare for d/c tomorrow   Recommendations for follow up therapy are one component of a multi-disciplinary discharge planning process, led by the attending physician.  Recommendations may be updated based on patient status, additional functional criteria and insurance authorization.  Follow Up Recommendations  Follow physician's recommendations for discharge plan and follow up therapies     Assistance Recommended at Discharge Frequent or constant Supervision/Assistance  Patient can return home with the following A little help with walking and/or transfers;A little help with bathing/dressing/bathroom;Assist for transportation;Help with stairs or ramp for entrance;Assistance with cooking/housework   Equipment Recommendations  Rolling walker (2 wheels);BSC/3in1 (youth RW)    Recommendations for Other Services       Precautions / Restrictions Precautions Precautions: Fall;Knee Precaution Comments: reviewed terminal knee extension/RLE hip in neutral Restrictions Weight Bearing Restrictions: No RLE Weight Bearing: Weight bearing as tolerated     Mobility  Bed Mobility                    Transfers Overall transfer level: Needs assistance Equipment used: Rolling walker (2 wheels) Transfers: Sit to/from Stand Sit to Stand: Min guard           General transfer comment: cues for LE management and use of UEs to self assist.    Ambulation/Gait Ambulation/Gait assistance: Min guard Gait Distance (Feet): 25 Feet Assistive device: Rolling walker (2 wheels) Gait Pattern/deviations: Step-to pattern, Decreased stance time - right, Decreased weight  shift to right Gait velocity: decr     General Gait Details: cues for sequence, and RW position. overall min/guard for balance and safety   Stairs             Wheelchair Mobility    Modified Rankin (Stroke Patients Only)       Balance     Sitting balance-Leahy Scale: Good     Standing balance support: Bilateral upper extremity supported, During functional activity, Reliant on assistive device for balance Standing balance-Leahy Scale: Poor                              Cognition Arousal/Alertness: Awake/alert Behavior During Therapy: WFL for tasks assessed/performed Overall Cognitive Status: Impaired/Different from baseline                               Problem Solving: Slow processing, Requires verbal cues General Comments: initiation and response time improving        Exercises      General Comments        Pertinent Vitals/Pain Pain Assessment Pain Assessment: 0-10 Pain Score: 4  Pain Location: R knee pain and back pain--"nerve" pain improved with movement Pain Descriptors / Indicators: Grimacing, Guarding, Sore Pain Intervention(s): Limited activity within patient's tolerance, Monitored during session, Premedicated before session    Home Living                          Prior Function            PT Goals (current goals can now  be found in the care plan section) Acute Rehab PT Goals Patient Stated Goal: less pain PT Goal Formulation: With patient Time For Goal Achievement: 09/16/22 Potential to Achieve Goals: Good Progress towards PT goals: Progressing toward goals    Frequency    7X/week      PT Plan Current plan remains appropriate    Co-evaluation              AM-PAC PT "6 Clicks" Mobility   Outcome Measure  Help needed turning from your back to your side while in a flat bed without using bedrails?: A Little Help needed moving from lying on your back to sitting on the side of a flat bed  without using bedrails?: A Little Help needed moving to and from a bed to a chair (including a wheelchair)?: A Little Help needed standing up from a chair using your arms (e.g., wheelchair or bedside chair)?: A Little Help needed to walk in hospital room?: A Little Help needed climbing 3-5 steps with a railing? : A Lot 6 Click Score: 17    End of Session Equipment Utilized During Treatment: Gait belt Activity Tolerance: Patient tolerated treatment well;Patient limited by fatigue Patient left: with call bell/phone within reach;with family/visitor present;in chair;with chair alarm set Nurse Communication: Mobility status PT Visit Diagnosis: Other abnormalities of gait and mobility (R26.89);Difficulty in walking, not elsewhere classified (R26.2)     Time: 3716-9678 PT Time Calculation (min) (ACUTE ONLY): 31 min  Charges:  $Gait Training: 23-37 mins                     Baxter Flattery, PT  Acute Rehab Dept [facility]) [phone]  [facility] Weekend Pager [facility] only)  [phone]  09/16/2022    [facility] 09/16/2022, 2:04 PM

## 2022-09-16 NOTE — Progress Notes (Addendum)
   Subjective: 7 Days Post-Op Procedure(s) (LRB): TOTAL KNEE ARTHROPLASTY (Right) Patient reports pain as mild.   Patient seen in rounds for Dr. Alvan Dame. Patient is well, and has had no acute complaints or problems. No acute events overnight. Made significant progress with PT yesterday, and was able to ambulate 35 feet.  We will continue therapy today.   Objective: Vital signs in last 24 hours: Temp:  [98.4 F (36.9 C)-98.6 F (37 C)] 98.4 F (36.9 C) (12/05 0601) Pulse Rate:  [63-73] 63 (12/05 0601) Resp:  [16-18] 16 (12/05 0601) BP: (118-135)/(57-68) 118/68 (12/05 0601) SpO2:  [92 %-97 %] 92 % (12/05 0601)  Intake/Output from previous day:  Intake/Output Summary (Last 24 hours) at 09/16/2022 0722 Last data filed at 09/16/2022 0600 Gross per 24 hour  Intake 840 ml  Output --  Net 840 ml     Intake/Output this shift: No intake/output data recorded.  Labs: Recent Labs    09/15/22 0735  HGB 9.9*   Recent Labs    09/15/22 0735  WBC 5.7  RBC 3.13*  HCT 31.3*  PLT 233   Recent Labs    09/15/22 0735  NA 135  K 3.7  CL 101  CO2 28  BUN 29*  CREATININE 1.06*  GLUCOSE 109*  CALCIUM 8.8*   No results for input(s): "LABPT", "INR" in the last 72 hours.  Exam: General - Patient is Alert and Oriented Extremity - Neurologically intact Sensation intact distally Intact pulses distally Dorsiflexion/Plantar flexion intact Dressing - dressing C/D/I Motor Function - intact, moving foot and toes well on exam.   Past Medical History:  Diagnosis Date   Arthritis    Back pain, chronic    Cancer (HCC)    skin   Chronic UTI    Dementia (HCC)    Dysphonia    Headache    Hypertension     Assessment/Plan: 7 Days Post-Op Procedure(s) (LRB): TOTAL KNEE ARTHROPLASTY (Right) Principal Problem:   S/P total knee arthroplasty, right  Estimated body mass index is 27.56 kg/m as calculated from the following:   Height as of this encounter: 5' (1.524 m).   Weight as of  this encounter: 64 kg. Advance diet Up with therapy D/C IV fluids   DVT Prophylaxis - Aspirin Weight bearing as tolerated.  Plan is to go Home after hospital stay. Plan for possible discharge today following 1-2 sessions of PT as long as they are meeting their goals.   Patient is scheduled for HHPT. Follow up in the office in 2 weeks.   Update: Talked with her daughter. Daughter lives in [place] and will need to be here to help her into the house. Discharge planned for tomorrow pending PT today.  Griffith Citron, PA-C Orthopedic Surgery [phone] 09/16/2022, 7:22 AM

## 2022-09-16 NOTE — Progress Notes (Signed)
Physical Therapy Treatment Patient Details Name: Tina Price MRN: [id number] DOB: Jan 13, 1941 Today's Date: 09/16/2022   History of Present Illness 81 yo female s/p R TKA on 09/09/22. PMH: colon CA, lumbar fusion, dysphonia, , RCR, HTN    PT Comments    Pt is doing well, able to ascend/descend 5 steps x2 with min/guard this pm. Pt is ready to d/c after one session tomorrow. Continue PT POC.  Recommendations for follow up therapy are one component of a multi-disciplinary discharge planning process, led by the attending physician.  Recommendations may be updated based on patient status, additional functional criteria and insurance authorization.  Follow Up Recommendations  Follow physician's recommendations for discharge plan and follow up therapies     Assistance Recommended at Discharge Frequent or constant Supervision/Assistance  Patient can return home with the following A little help with walking and/or transfers;A little help with bathing/dressing/bathroom;Assist for transportation;Help with stairs or ramp for entrance;Assistance with cooking/housework   Equipment Recommendations  Rolling walker (2 wheels);BSC/3in1 (youth RW)    Recommendations for Other Services       Precautions / Restrictions Precautions Precautions: Fall;Knee Precaution Comments: reviewed terminal knee extension/RLE hip in neutral Restrictions Weight Bearing Restrictions: No RLE Weight Bearing: Weight bearing as tolerated     Mobility  Bed Mobility   Bed Mobility: Supine to Sit, Sit to Supine     Supine to sit: Supervision Sit to supine: Supervision   General bed mobility comments: pt able to progress RLE on and off bed with incr time    Transfers Overall transfer level: Needs assistance Equipment used: Rolling walker (2 wheels) Transfers: Sit to/from Stand Sit to Stand: Min guard, Supervision           General transfer comment: cues for LE management and use of UEs to self  assist.    Ambulation/Gait Ambulation/Gait assistance: Min guard Gait Distance (Feet): 18 Feet Assistive device: Rolling walker (2 wheels) Gait Pattern/deviations: Step-to pattern, Decreased stance time - right, Decreased weight shift to right Gait velocity: decr     General Gait Details: cues for sequence, and RW position. overall min/guard for balance and safety   Stairs Stairs: Yes Stairs assistance: Min guard Stair Management: Two rails, Step to pattern, Forwards Number of Stairs: 5 (x2) General stair comments: cues for sequence and technique. good stability, no buckling   Wheelchair Mobility    Modified Rankin (Stroke Patients Only)       Balance     Sitting balance-Leahy Scale: Good     Standing balance support: Bilateral upper extremity supported, During functional activity, Reliant on assistive device for balance Standing balance-Leahy Scale: Poor                              Cognition Arousal/Alertness: Awake/alert Behavior During Therapy: WFL for tasks assessed/performed Overall Cognitive Status: Within Functional Limits for tasks assessed                               Problem Solving: Slow processing General Comments: initiation and response time improving        Exercises      General Comments        Pertinent Vitals/Pain Pain Assessment Pain Assessment: 0-10 Pain Score: 4  Pain Location: R knee pain and back pain--"nerve" pain improved with movement Pain Descriptors / Indicators: Grimacing, Guarding, Sore Pain Intervention(s): Limited activity within patient's  tolerance, Monitored during session, Premedicated before session, Repositioned    Home Living                          Prior Function            PT Goals (current goals can now be found in the care plan section) Acute Rehab PT Goals Patient Stated Goal: less pain PT Goal Formulation: With patient Time For Goal Achievement:  09/16/22 Potential to Achieve Goals: Good Progress towards PT goals: Progressing toward goals    Frequency    7X/week      PT Plan Current plan remains appropriate    Co-evaluation              AM-PAC PT "6 Clicks" Mobility   Outcome Measure  Help needed turning from your back to your side while in a flat bed without using bedrails?: A Little Help needed moving from lying on your back to sitting on the side of a flat bed without using bedrails?: A Little Help needed moving to and from a bed to a chair (including a wheelchair)?: A Little Help needed standing up from a chair using your arms (e.g., wheelchair or bedside chair)?: A Little Help needed to walk in hospital room?: A Little Help needed climbing 3-5 steps with a railing? : A Lot 6 Click Score: 17    End of Session Equipment Utilized During Treatment: Gait belt Activity Tolerance: Patient tolerated treatment well Patient left: with call bell/phone within reach;with family/visitor present;with chair alarm set;in bed Nurse Communication: Mobility status PT Visit Diagnosis: Other abnormalities of gait and mobility (R26.89);Difficulty in walking, not elsewhere classified (R26.2)     Time: 9407-6808 PT Time Calculation (min) (ACUTE ONLY): 38 min  Charges:  $Gait Training: 23-37 mins $Therapeutic Activity: 8-22 mins                     Baxter Flattery, PT  Acute Rehab Dept [facility]) [phone]  [facility] Weekend Pager [facility] only)  [phone]  09/16/2022    [facility] 09/16/2022, 4:48 PM

## 2022-09-17 MED ORDER — HYDROMORPHONE HCL 2 MG PO TABS: 2.0000 mg | ORAL_TABLET | ORAL | 0 refills | Status: AC | PRN

## 2022-09-17 NOTE — Plan of Care (Signed)
Discharge instructions given to the patient including medications and follow up.  

## 2022-09-17 NOTE — Progress Notes (Signed)
Physical Therapy Treatment Patient Details Name: Tina Price MRN: [id number] DOB: 08/18/1941 Today's Date: 09/17/2022   History of Present Illness 81 yo female s/p R TKA on 09/09/22. PMH: colon CA, lumbar fusion, dysphonia, , RCR, HTN    PT Comments    Reviewed areas below with pt and family. Pt is ready to d/c with family/caregiver assist as needed from PT standpoint   Recommendations for follow up therapy are one component of a multi-disciplinary discharge planning process, led by the attending physician.  Recommendations may be updated based on patient status, additional functional criteria and insurance authorization.  Follow Up Recommendations  Follow physician's recommendations for discharge plan and follow up therapies     Assistance Recommended at Discharge Frequent or constant Supervision/Assistance  Patient can return home with the following A little help with walking and/or transfers;A little help with bathing/dressing/bathroom;Assist for transportation;Help with stairs or ramp for entrance;Assistance with cooking/housework   Equipment Recommendations       Recommendations for Other Services       Precautions / Restrictions Precautions Precautions: Fall;Knee Precaution Booklet Issued: No Precaution Comments: reviewed terminal knee extension/RLE hip in neutral Restrictions Weight Bearing Restrictions: No RLE Weight Bearing: Weight bearing as tolerated     Mobility  Bed Mobility               General bed mobility comments: in recliner    Transfers Overall transfer level: Needs assistance Equipment used: Rolling walker (2 wheels) Transfers: Sit to/from Stand Sit to Stand: Supervision           General transfer comment: pt self cues    Ambulation/Gait Ambulation/Gait assistance: Supervision Gait Distance (Feet): 60 Feet Assistive device: Rolling walker (2 wheels) Gait Pattern/deviations: Step-to pattern, Decreased stance time - right,  Decreased weight shift to right       General Gait Details: cues for sequence, and RW position. overall min/guard to supervision for balance and safety; no overt LOB   Stairs             Wheelchair Mobility    Modified Rankin (Stroke Patients Only)       Balance     Sitting balance-Leahy Scale: Good     Standing balance support: Bilateral upper extremity supported, During functional activity, Reliant on assistive device for balance Standing balance-Leahy Scale: Poor Standing balance comment: reliant on device for static balance                            Cognition Arousal/Alertness: Awake/alert Behavior During Therapy: WFL for tasks assessed/performed Overall Cognitive Status: Within Functional Limits for tasks assessed                                          Exercises Total Joint Exercises Ankle Circles/Pumps: AROM, Both, 10 reps Quad Sets: AROM, Right, 5 reps Straight Leg Raises: AROM, Right, 5 reps, AAROM    General Comments        Pertinent Vitals/Pain Pain Assessment Pain Assessment: Faces Faces Pain Scale: Hurts a little bit Pain Location: right knee Pain Descriptors / Indicators: Discomfort Pain Intervention(s): Limited activity within patient's tolerance, Monitored during session, Premedicated before session    Home Living                          Prior Function  PT Goals (current goals can now be found in the care plan section) Acute Rehab PT Goals Patient Stated Goal: less pain PT Goal Formulation: With patient Time For Goal Achievement: 09/16/22 Potential to Achieve Goals: Good Progress towards PT goals: Progressing toward goals    Frequency    7X/week      PT Plan Current plan remains appropriate    Co-evaluation              AM-PAC PT "6 Clicks" Mobility   Outcome Measure  Help needed turning from your back to your side while in a flat bed without using  bedrails?: A Little Help needed moving from lying on your back to sitting on the side of a flat bed without using bedrails?: None Help needed moving to and from a bed to a chair (including a wheelchair)?: A Little Help needed standing up from a chair using your arms (e.g., wheelchair or bedside chair)?: A Little Help needed to walk in hospital room?: A Little Help needed climbing 3-5 steps with a railing? : A Little 6 Click Score: 19    End of Session Equipment Utilized During Treatment: Gait belt Activity Tolerance: Patient tolerated treatment well Patient left: with call bell/phone within reach;with family/visitor present;with chair alarm set;in chair Nurse Communication: Mobility status PT Visit Diagnosis: Other abnormalities of gait and mobility (R26.89);Difficulty in walking, not elsewhere classified (R26.2)     Time: 4287-6811 PT Time Calculation (min) (ACUTE ONLY): 30 min  Charges:  $Gait Training: 23-37 mins                     Baxter Flattery, PT  Acute Rehab Dept [facility]) [phone]  [facility] Weekend Pager [facility] only)  [phone]  09/17/2022    [facility] 09/17/2022, 11:56 AM

## 2022-10-07 NOTE — Discharge Summary (Signed)
Physician Discharge Summary   Patient ID: Tina Price MRN: [id number] DOB/AGE: [birth year] 81 y.o.  Admit date: 09/09/2022 Discharge date: 09/17/2022  Primary Diagnosis: Right knee osteoarthritis.   Admission Diagnoses:  Past Medical History:  Diagnosis Date   Arthritis    Back pain, chronic    Cancer ([place])    skin   Chronic UTI    Dementia ([place])    Dysphonia    Headache    Hypertension    Discharge Diagnoses:   Principal Problem:   S/P total knee arthroplasty, right  Estimated body mass index is 27.56 kg/m as calculated from the following:   Height as of this encounter: 5' (1.524 m).   Weight as of this encounter: 64 kg.  Procedure:  Procedure(s) (LRB): TOTAL KNEE ARTHROPLASTY (Right)   Consults: None  HPI: Tina Price is a 81 y.o. female patient of   mine.  The patient had been seen, evaluated, and treated for months conservatively in the   office with medication, activity modification, and injections.  The patient had   radiographic changes of bone-on-bone arthritis with endplate sclerosis and osteophytes noted.  Based on the radiographic changes and failed conservative measures, the patient   decided to proceed with definitive treatment, total knee replacement.  Risks of infection, DVT, component failure, need for revision surgery, neurovascular injury were reviewed in the office setting.  The postop course was reviewed stressing the efforts to maximize post-operative satisfaction and function.  Consent was obtained for benefit of pain   relief.   Laboratory Data: Admission on 09/09/2022, Discharged on 09/17/2022  Component Date Value Ref Range Status   WBC 09/10/2022 8.1  4.0 - 10.5 K/uL Final   RBC 09/10/2022 3.00 (L)  3.87 - 5.11 MIL/uL Final   Hemoglobin 09/10/2022 9.5 (L)  12.0 - 15.0 g/dL Final   HCT 09/10/2022 29.5 (L)  36.0 - 46.0 % Final   MCV 09/10/2022 98.3  80.0 - 100.0 fL Final   MCH 09/10/2022 31.7  26.0 - 34.0 pg Final    MCHC 09/10/2022 32.2  30.0 - 36.0 g/dL Final   RDW 09/10/2022 12.8  11.5 - 15.5 % Final   Platelets 09/10/2022 187  150 - 400 K/uL Final   nRBC 09/10/2022 0.0  0.0 - 0.2 % Final   Performed at [facility], [place] [address] [place], [place] [address]   Sodium 09/10/2022 137  135 - 145 mmol/L Final   Potassium 09/10/2022 4.7  3.5 - 5.1 mmol/L Final   Chloride 09/10/2022 106  98 - 111 mmol/L Final   CO2 09/10/2022 26  22 - 32 mmol/L Final   Glucose, Bld 09/10/2022 143 (H)  70 - 99 mg/dL Final   Glucose reference range applies only to samples taken after fasting for at least 8 hours.   BUN 09/10/2022 22  8 - 23 mg/dL Final   Creatinine, Ser 09/10/2022 1.08 (H)  0.44 - 1.00 mg/dL Final   Calcium 09/10/2022 8.8 (L)  8.9 - 10.3 mg/dL Final   GFR, Estimated 09/10/2022 52 (L)  >60 mL/min Final   Comment: (NOTE) Calculated using the CKD-EPI Creatinine Equation (2021)    Anion gap 09/10/2022 5  5 - 15 Final   Performed at [facility], [place] [address], [place], [place] [address]   WBC 09/11/2022 9.6  4.0 - 10.5 K/uL Final   RBC 09/11/2022 3.22 (L)  3.87 - 5.11 MIL/uL Final   Hemoglobin 09/11/2022 10.3 (L)  12.0 -  15.0 g/dL Final   HCT 09/11/2022 31.7 (L)  36.0 - 46.0 % Final   MCV 09/11/2022 98.4  80.0 - 100.0 fL Final   MCH 09/11/2022 32.0  26.0 - 34.0 pg Final   MCHC 09/11/2022 32.5  30.0 - 36.0 g/dL Final   RDW 09/11/2022 13.0  11.5 - 15.5 % Final   Platelets 09/11/2022 198  150 - 400 K/uL Final   nRBC 09/11/2022 0.0  0.0 - 0.2 % Final   Performed at [facility], [place] [address] [place], [place] [address]   Sodium 09/15/2022 135  135 - 145 mmol/L Final   Potassium 09/15/2022 3.7  3.5 - 5.1 mmol/L Final   Chloride 09/15/2022 101  98 - 111 mmol/L Final   CO2 09/15/2022 28  22 - 32 mmol/L Final   Glucose, Bld 09/15/2022 109 (H)  70 - 99 mg/dL Final   Glucose reference range applies only to samples taken after fasting for at least 8  hours.   BUN 09/15/2022 29 (H)  8 - 23 mg/dL Final   Creatinine, Ser 09/15/2022 1.06 (H)  0.44 - 1.00 mg/dL Final   Calcium 09/15/2022 8.8 (L)  8.9 - 10.3 mg/dL Final   Total Protein 09/15/2022 5.8 (L)  6.5 - 8.1 g/dL Final   Albumin 09/15/2022 2.9 (L)  3.5 - 5.0 g/dL Final   AST 09/15/2022 12 (L)  15 - 41 U/L Final   ALT 09/15/2022 12  0 - 44 U/L Final   Alkaline Phosphatase 09/15/2022 55  38 - 126 U/L Final   Total Bilirubin 09/15/2022 0.7  0.3 - 1.2 mg/dL Final   GFR, Estimated 09/15/2022 53 (L)  >60 mL/min Final   Comment: (NOTE) Calculated using the CKD-EPI Creatinine Equation (2021)    Anion gap 09/15/2022 6  5 - 15 Final   Performed at [facility], [place] [address] [place], [place] [address]   WBC 09/15/2022 5.7  4.0 - 10.5 K/uL Final   RBC 09/15/2022 3.13 (L)  3.87 - 5.11 MIL/uL Final   Hemoglobin 09/15/2022 9.9 (L)  12.0 - 15.0 g/dL Final   HCT 09/15/2022 31.3 (L)  36.0 - 46.0 % Final   MCV 09/15/2022 100.0  80.0 - 100.0 fL Final   MCH 09/15/2022 31.6  26.0 - 34.0 pg Final   MCHC 09/15/2022 31.6  30.0 - 36.0 g/dL Final   RDW 09/15/2022 13.7  11.5 - 15.5 % Final   Platelets 09/15/2022 233  150 - 400 K/uL Final   nRBC 09/15/2022 0.0  0.0 - 0.2 % Final   Performed at [facility], [place] [address], [place], [place] [address]  Hospital Outpatient Visit on 08/27/2022  Component Date Value Ref Range Status   MRSA, PCR 08/27/2022 NEGATIVE  NEGATIVE Final   Staphylococcus aureus 08/27/2022 NEGATIVE  NEGATIVE Final   Comment: (NOTE) The Xpert SA Assay (FDA approved for NASAL specimens in patients 33 years of age and older), is one component of a comprehensive surveillance program. It is not intended to diagnose infection nor to guide or monitor treatment. Performed at [facility], [place] [address] [place], [place] [address]    WBC 08/27/2022 4.2  4.0 - 10.5 K/uL Final   RBC 08/27/2022 3.67 (L)  3.87 - 5.11 MIL/uL Final    Hemoglobin 08/27/2022 11.5 (L)  12.0 - 15.0 g/dL Final   HCT 08/27/2022 36.6  36.0 - 46.0 % Final   MCV 08/27/2022 99.7  80.0 - 100.0  fL Final   MCH 08/27/2022 31.3  26.0 - 34.0 pg Final   MCHC 08/27/2022 31.4  30.0 - 36.0 g/dL Final   RDW 08/27/2022 13.0  11.5 - 15.5 % Final   Platelets 08/27/2022 170  150 - 400 K/uL Final   nRBC 08/27/2022 0.0  0.0 - 0.2 % Final   Performed at [facility], [place] [address], [place], [place] [address]   Sodium 08/27/2022 139  135 - 145 mmol/L Final   Potassium 08/27/2022 4.0  3.5 - 5.1 mmol/L Final   Chloride 08/27/2022 107  98 - 111 mmol/L Final   CO2 08/27/2022 27  22 - 32 mmol/L Final   Glucose, Bld 08/27/2022 117 (H)  70 - 99 mg/dL Final   Glucose reference range applies only to samples taken after fasting for at least 8 hours.   BUN 08/27/2022 21  8 - 23 mg/dL Final   Creatinine, Ser 08/27/2022 1.12 (H)  0.44 - 1.00 mg/dL Final   Calcium 08/27/2022 9.1  8.9 - 10.3 mg/dL Final   GFR, Estimated 08/27/2022 49 (L)  >60 mL/min Final   Comment: (NOTE) Calculated using the CKD-EPI Creatinine Equation (2021)    Anion gap 08/27/2022 5  5 - 15 Final   Performed at [facility], [place] [address] [place], [place] [address]     X-Rays:No results found.  EKG: Orders placed or performed during the hospital encounter of 09/09/22   EKG 12-LEAD   EKG 12-LEAD   EKG 12-Lead   EKG 12-Lead     Hospital Course: Tina Price is a 81 y.o. who was admitted to [facility]. They were brought to the operating room on 09/09/2022 and underwent Procedure(s): TOTAL KNEE ARTHROPLASTY.  Patient tolerated the procedure well and was later transferred to the recovery room and then to the orthopaedic floor for postoperative care. They were given PO and IV analgesics for pain control following their surgery. They were given 24 hours of postoperative antibiotics of  Anti-infectives (From admission, onward)    Start      Dose/Rate Route Frequency Ordered Stop   09/09/22 2100  cephALEXin (KEFLEX) capsule 250 mg  Status:  Discontinued        250 mg Oral Daily 09/09/22 1428 09/17/22 1832   09/09/22 1500  ceFAZolin (ANCEF) IVPB 2g/100 mL premix        2 g 200 mL/hr over 30 Minutes Intravenous Every 6 hours 09/09/22 1154 09/09/22 2100   09/09/22 1245  nitrofurantoin (MACRODANTIN) capsule 100 mg  Status:  Discontinued        100 mg Oral 2 times daily 09/09/22 1154 09/09/22 1222   09/09/22 0630  ceFAZolin (ANCEF) IVPB 2g/100 mL premix        2 g 200 mL/hr over 30 Minutes Intravenous On call to O.R. 09/09/22 [id number] 09/09/22 0854      and started on DVT prophylaxis in the form of Aspirin.   PT and OT were ordered for total joint protocol. Discharge planning consulted to help with postop disposition and equipment needs. Patient had a good night on the evening of surgery. They started to get up OOB with therapy on POD #0.  Continued to work with therapy on POD #1 and POD #2 but made slow progress with PT. It was determined she would need HHPT. Continued working with PT on days 3-6.    Pt was seen during rounds on day seven and was ready to go home pending progress  with therapy. Pt worked with therapy for two additional sessions and was meeting their goals. She was discharged to home later that day in stable condition.  Diet: Regular diet Activity: WBAT Follow-up: in 2 weeks Disposition: Home Discharged Condition: good   Discharge Instructions     Call MD / Call 911   Complete by: As directed    If you experience chest pain or shortness of breath, CALL 911 and be transported to the hospital emergency room.  If you develope a fever above 101 F, pus (white drainage) or increased drainage or redness at the wound, or calf pain, call your surgeon's office.   Call MD / Call 911   Complete by: As directed    If you experience chest pain or shortness of breath, CALL 911 and be transported to the hospital emergency room.   If you develope a fever above 101 F, pus (white drainage) or increased drainage or redness at the wound, or calf pain, call your surgeon's office.   Change dressing   Complete by: As directed    Maintain surgical dressing until follow up in the clinic. If the edges start to pull up, may reinforce with tape. If the dressing is no longer working, may remove and cover with gauze and tape, but must keep the area dry and clean.  Call with any questions or concerns.   Change dressing   Complete by: As directed    Maintain surgical dressing until follow up in the clinic. If the edges start to pull up, may reinforce with tape. If the dressing is no longer working, may remove and cover with gauze and tape, but must keep the area dry and clean.  Call with any questions or concerns.   Constipation Prevention   Complete by: As directed    Drink plenty of fluids.  Prune juice may be helpful.  You may use a stool softener, such as Colace (over the counter) 100 mg twice a day.  Use MiraLax (over the counter) for constipation as needed.   Constipation Prevention   Complete by: As directed    Drink plenty of fluids.  Prune juice may be helpful.  You may use a stool softener, such as Colace (over the counter) 100 mg twice a day.  Use MiraLax (over the counter) for constipation as needed.   Diet - low sodium heart healthy   Complete by: As directed    Diet - low sodium heart healthy   Complete by: As directed    Increase activity slowly as tolerated   Complete by: As directed    Weight bearing as tolerated with assist device (walker, cane, etc) as directed, use it as long as suggested by your surgeon or therapist, typically at least 4-6 weeks.   Increase activity slowly as tolerated   Complete by: As directed    Weight bearing as tolerated with assist device (walker, cane, etc) as directed, use it as long as suggested by your surgeon or therapist, typically at least 4-6 weeks.   Post-operative opioid taper  instructions:   Complete by: As directed    POST-OPERATIVE OPIOID TAPER INSTRUCTIONS: It is important to wean off of your opioid medication as soon as possible. If you do not need pain medication after your surgery it is ok to stop day one. Opioids include: Codeine, Hydrocodone(Norco, Vicodin), Oxycodone(Percocet, oxycontin) and hydromorphone amongst others.  Long term and even short term use of opiods can cause: Increased pain response Dependence Constipation Depression Respiratory depression  And more.  Withdrawal symptoms can include Flu like symptoms Nausea, vomiting And more Techniques to manage these symptoms Hydrate well Eat regular healthy meals Stay active Use relaxation techniques(deep breathing, meditating, yoga) Do Not substitute Alcohol to help with tapering If you have been on opioids for less than two weeks and do not have pain than it is ok to stop all together.  Plan to wean off of opioids This plan should start within one week post op of your joint replacement. Maintain the same interval or time between taking each dose and first decrease the dose.  Cut the total daily intake of opioids by one tablet each day Next start to increase the time between doses. The last dose that should be eliminated is the evening dose.      Post-operative opioid taper instructions:   Complete by: As directed    POST-OPERATIVE OPIOID TAPER INSTRUCTIONS: It is important to wean off of your opioid medication as soon as possible. If you do not need pain medication after your surgery it is ok to stop day one. Opioids include: Codeine, Hydrocodone(Norco, Vicodin), Oxycodone(Percocet, oxycontin) and hydromorphone amongst others.  Long term and even short term use of opiods can cause: Increased pain response Dependence Constipation Depression Respiratory depression And more.  Withdrawal symptoms can include Flu like symptoms Nausea, vomiting And more Techniques to manage these  symptoms Hydrate well Eat regular healthy meals Stay active Use relaxation techniques(deep breathing, meditating, yoga) Do Not substitute Alcohol to help with tapering If you have been on opioids for less than two weeks and do not have pain than it is ok to stop all together.  Plan to wean off of opioids This plan should start within one week post op of your joint replacement. Maintain the same interval or time between taking each dose and first decrease the dose.  Cut the total daily intake of opioids by one tablet each day Next start to increase the time between doses. The last dose that should be eliminated is the evening dose.      TED hose   Complete by: As directed    Use stockings (TED hose) for 2 weeks on both leg(s).  You may remove them at night for sleeping.   TED hose   Complete by: As directed    Use stockings (TED hose) for 2 weeks on both leg(s).  You may remove them at night for sleeping.      Allergies as of 09/17/2022       Reactions   Doxycycline Other (See Comments)   Knocks her out   Penicillins Rash   Reaction: was in her 20's.  Tolerates Cephalosporins   Tolerated Cephalosporin Date: 09/09/22.   Sulfa Antibiotics Rash   Blistering in your mouth        Medication List     STOP taking these medications    diazepam 5 MG tablet Commonly known as: VALIUM   HYDROcodone-acetaminophen 5-325 MG tablet Commonly known as: NORCO/VICODIN   nitrofurantoin 100 MG capsule Commonly known as: MACRODANTIN   traMADol 50 MG tablet Commonly known as: ULTRAM       TAKE these medications    acetaminophen 500 MG tablet Commonly known as: TYLENOL Take 2 tablets (1,000 mg total) by mouth every 6 (six) hours.   aspirin 81 MG chewable tablet Chew 1 tablet (81 mg total) by mouth 2 (two) times daily for 28 days.   AZO-CRANBERRY PO Take 1 tablet by mouth daily.   buPROPion 150  MG 24 hr tablet Commonly known as: WELLBUTRIN XL Take 150 mg by mouth every  morning.   cephALEXin 250 MG capsule Commonly known as: KEFLEX Take 250 mg by mouth daily.   hydrochlorothiazide 12.5 MG capsule Commonly known as: MICROZIDE Take 12.5 mg by mouth daily.   HYDROmorphone 2 MG tablet Commonly known as: DILAUDID Take 1 tablet (2 mg total) by mouth every 4 (four) hours as needed. Take 1 tablet by mouth every 4-6 hours as needed for severe pain   methocarbamol 500 MG tablet Commonly known as: ROBAXIN Take 1 tablet (500 mg total) by mouth every 6 (six) hours as needed for muscle spasms.   Myrbetriq 50 MG Tb24 tablet Generic drug: mirabegron ER Take 50 mg by mouth daily.   polyethylene glycol 17 g packet Commonly known as: MIRALAX / GLYCOLAX Take 17 g by mouth 2 (two) times daily.   sertraline 50 MG tablet Commonly known as: ZOLOFT Take 50 mg by mouth daily.   simvastatin 20 MG tablet Commonly known as: ZOCOR Take 20 mg by mouth at bedtime.   topiramate 25 MG tablet Commonly known as: TOPAMAX Take 50 mg by mouth at bedtime as needed (vertigo).   verapamil 240 MG CR tablet Commonly known as: CALAN-SR Take 240 mg by mouth at bedtime.       ASK your doctor about these medications    senna 8.6 MG Tabs tablet Commonly known as: SENOKOT Take 1 tablet (8.6 mg total) by mouth at bedtime for 14 days. Ask about: Should I take this medication?               Discharge Care Instructions  (From admission, onward)           Start     Ordered   09/17/22 0000  Change dressing       Comments: Maintain surgical dressing until follow up in the clinic. If the edges start to pull up, may reinforce with tape. If the dressing is no longer working, may remove and cover with gauze and tape, but must keep the area dry and clean.  Call with any questions or concerns.   09/17/22 0800   09/10/22 0000  Change dressing       Comments: Maintain surgical dressing until follow up in the clinic. If the edges start to pull up, may reinforce with tape. If  the dressing is no longer working, may remove and cover with gauze and tape, but must keep the area dry and clean.  Call with any questions or concerns.   09/10/22 0754            Follow-up Information     Paralee Cancel, MD. Go on 09/24/2022.   Specialty: Orthopedic Surgery Why: You are scheduled for follow up on Wednesday December 13th at 10:30am. Contact information: [address] [place] [address] [phone]         Rosilyn Mings.. Go on 09/12/2022.   Why: You are scheduled for physical therapy eval on Friday December 1st at 2:15pm. Contact information: [address] [place] [address] [phone]         [facility] [place] Follow up.   Specialty: [place] Why: to provide home physical therapy visits Contact information: [address] [place] [address] [phone]                 Signed: Griffith Citron, PA-C Orthopedic Surgery 10/07/2022, 7:02 AM

## 2022-10-15 DIAGNOSIS — M5416 Radiculopathy, lumbar region: Secondary | ICD-10-CM | POA: Diagnosis not present

## 2022-10-31 DIAGNOSIS — Z471 Aftercare following joint replacement surgery: Secondary | ICD-10-CM | POA: Diagnosis not present

## 2022-10-31 DIAGNOSIS — Z96651 Presence of right artificial knee joint: Secondary | ICD-10-CM | POA: Diagnosis not present

## 2022-11-05 DIAGNOSIS — N3020 Other chronic cystitis without hematuria: Secondary | ICD-10-CM | POA: Diagnosis not present

## 2022-11-05 DIAGNOSIS — R8271 Bacteriuria: Secondary | ICD-10-CM | POA: Diagnosis not present

## 2022-11-18 DIAGNOSIS — M5416 Radiculopathy, lumbar region: Secondary | ICD-10-CM | POA: Diagnosis not present

## 2022-11-25 DIAGNOSIS — M25661 Stiffness of right knee, not elsewhere classified: Secondary | ICD-10-CM | POA: Diagnosis not present

## 2022-11-25 DIAGNOSIS — M25561 Pain in right knee: Secondary | ICD-10-CM | POA: Diagnosis not present

## 2022-11-28 DIAGNOSIS — M25661 Stiffness of right knee, not elsewhere classified: Secondary | ICD-10-CM | POA: Diagnosis not present

## 2022-11-28 DIAGNOSIS — M25561 Pain in right knee: Secondary | ICD-10-CM | POA: Diagnosis not present

## 2022-12-01 DIAGNOSIS — M25561 Pain in right knee: Secondary | ICD-10-CM | POA: Diagnosis not present

## 2022-12-01 DIAGNOSIS — M25661 Stiffness of right knee, not elsewhere classified: Secondary | ICD-10-CM | POA: Diagnosis not present

## 2022-12-04 DIAGNOSIS — M25561 Pain in right knee: Secondary | ICD-10-CM | POA: Diagnosis not present

## 2022-12-04 DIAGNOSIS — M25661 Stiffness of right knee, not elsewhere classified: Secondary | ICD-10-CM | POA: Diagnosis not present

## 2022-12-09 DIAGNOSIS — M25661 Stiffness of right knee, not elsewhere classified: Secondary | ICD-10-CM | POA: Diagnosis not present

## 2022-12-09 DIAGNOSIS — M25561 Pain in right knee: Secondary | ICD-10-CM | POA: Diagnosis not present

## 2022-12-12 DIAGNOSIS — M25661 Stiffness of right knee, not elsewhere classified: Secondary | ICD-10-CM | POA: Diagnosis not present

## 2022-12-12 DIAGNOSIS — M25561 Pain in right knee: Secondary | ICD-10-CM | POA: Diagnosis not present

## 2022-12-15 DIAGNOSIS — M25561 Pain in right knee: Secondary | ICD-10-CM | POA: Diagnosis not present

## 2022-12-15 DIAGNOSIS — M25661 Stiffness of right knee, not elsewhere classified: Secondary | ICD-10-CM | POA: Diagnosis not present

## 2022-12-15 IMAGING — MG MM DIGITAL SCREENING BILAT W/ TOMO AND CAD
8 series · 9 of 24 positions shown · non-contrast
Comparison: Previous exam(s).

CLINICAL DATA: Screening.

EXAM:
DIGITAL SCREENING BILATERAL MAMMOGRAM WITH TOMOSYNTHESIS AND CAD
TECHNIQUE: Bilateral screening digital craniocaudal and mediolateral oblique
mammograms were obtained. Bilateral screening digital breast
tomosynthesis was performed. The images were evaluated with
computer-aided detection.

[R MLO synth-2D]
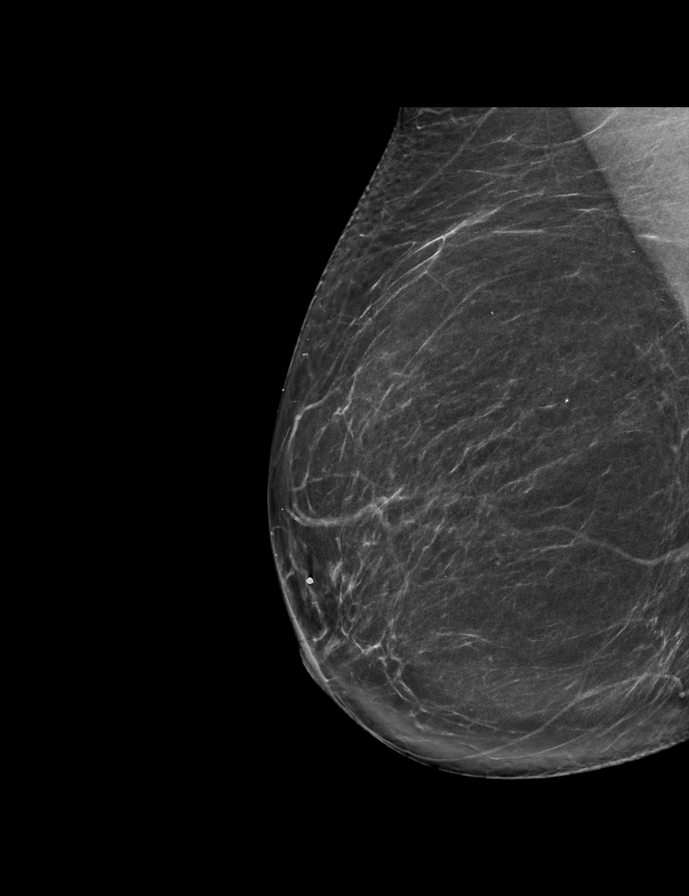

[L MLO synth-2D]
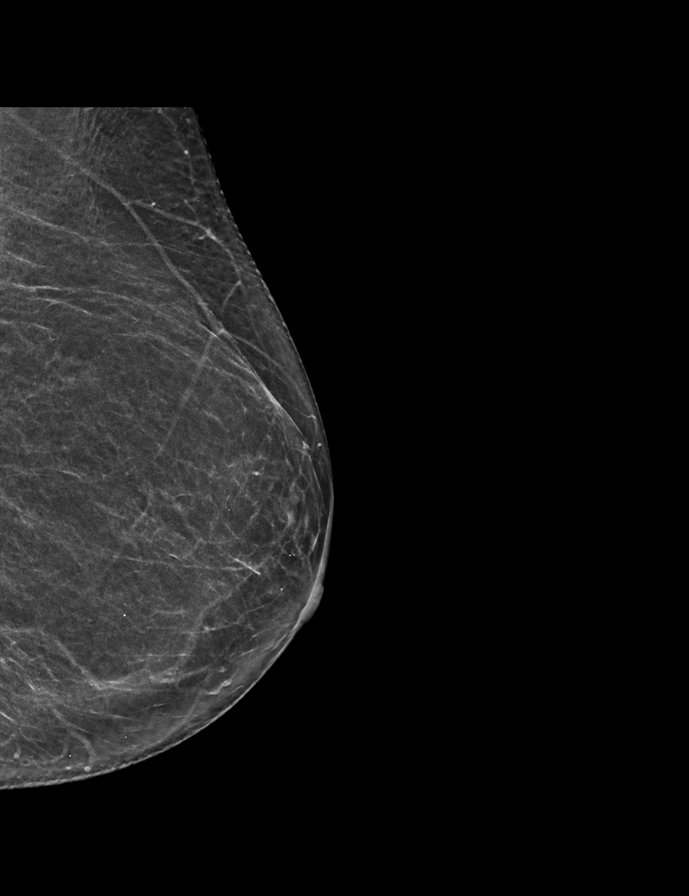

[R CC synth-2D]
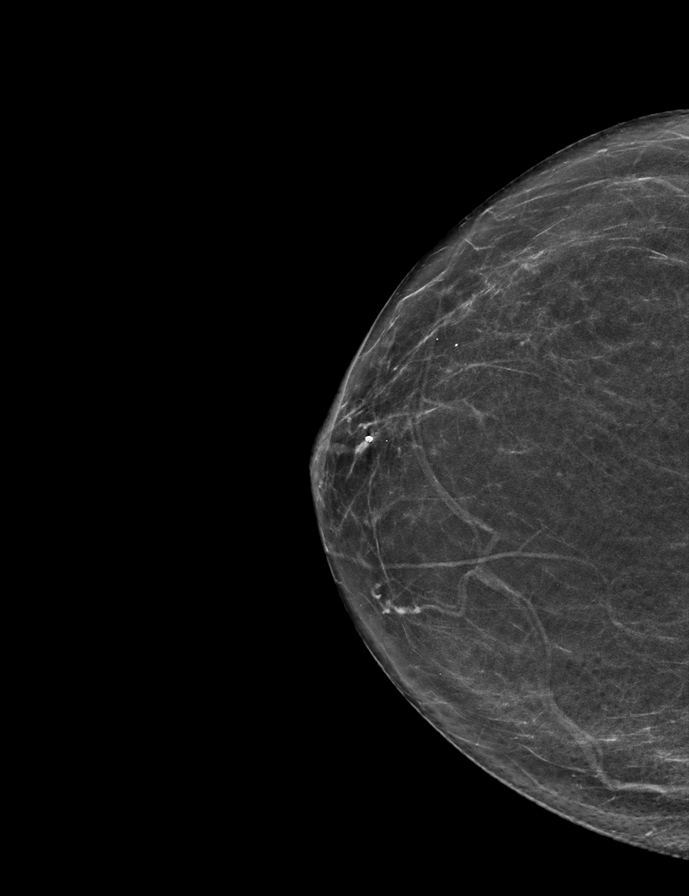

[L CC synth-2D]
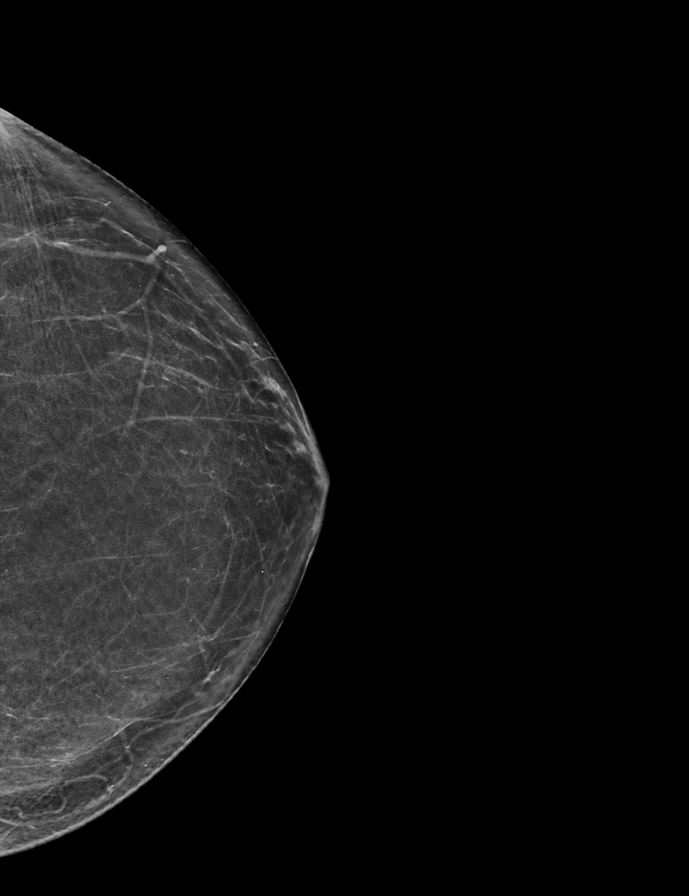

[R CC tomo · 2 of 59 frames shown]
[frame 20/59]
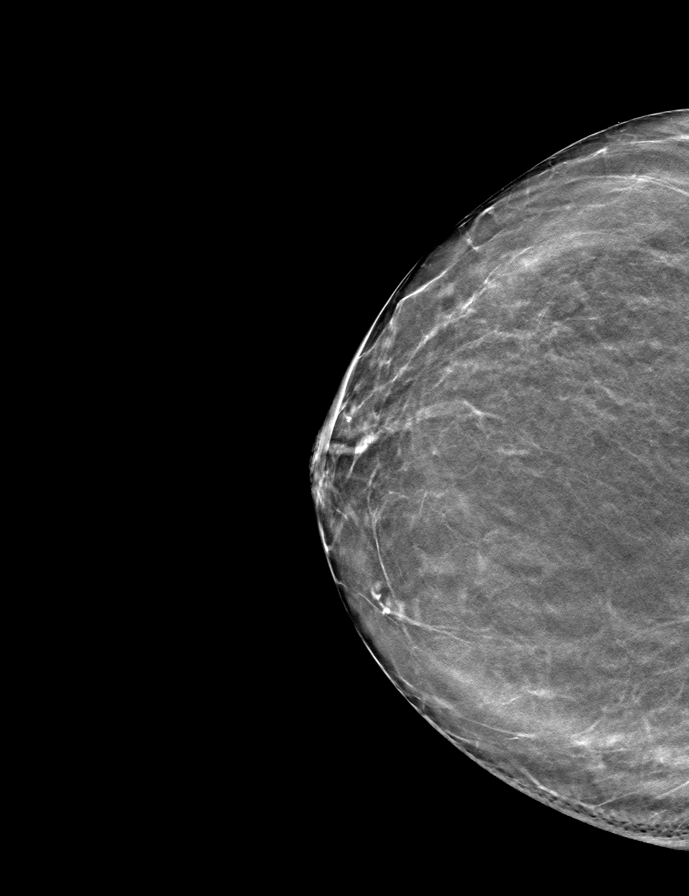
[frame 30/59]
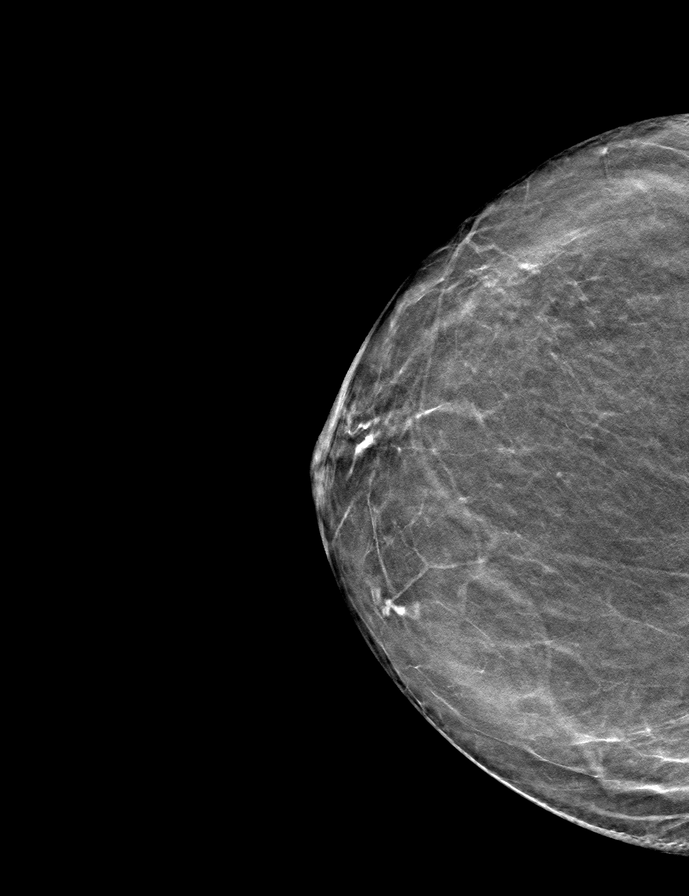

[R MLO tomo · tomo slice 33/66.0]
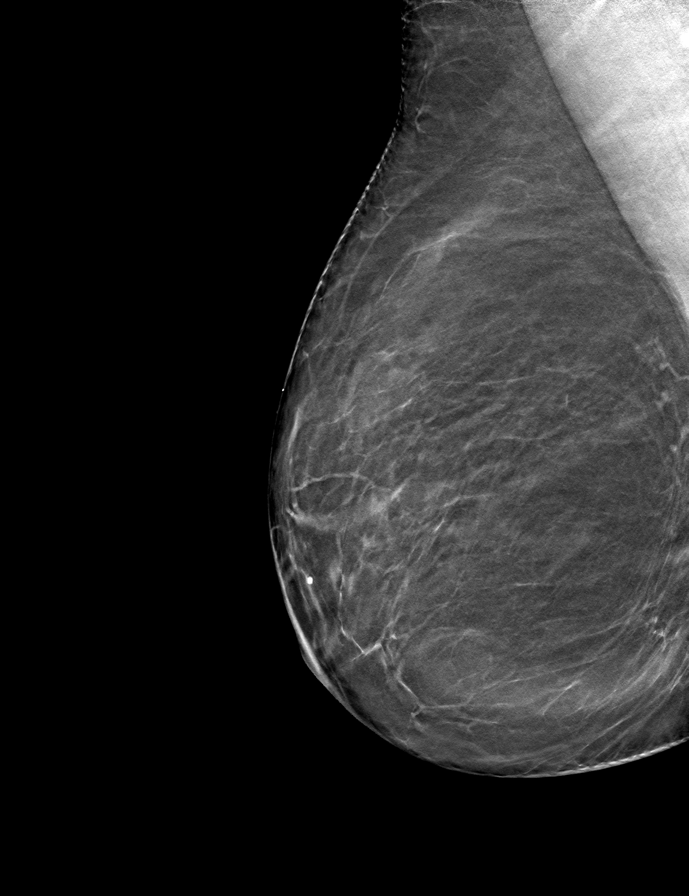

[L CC tomo · tomo slice 30/59.0]
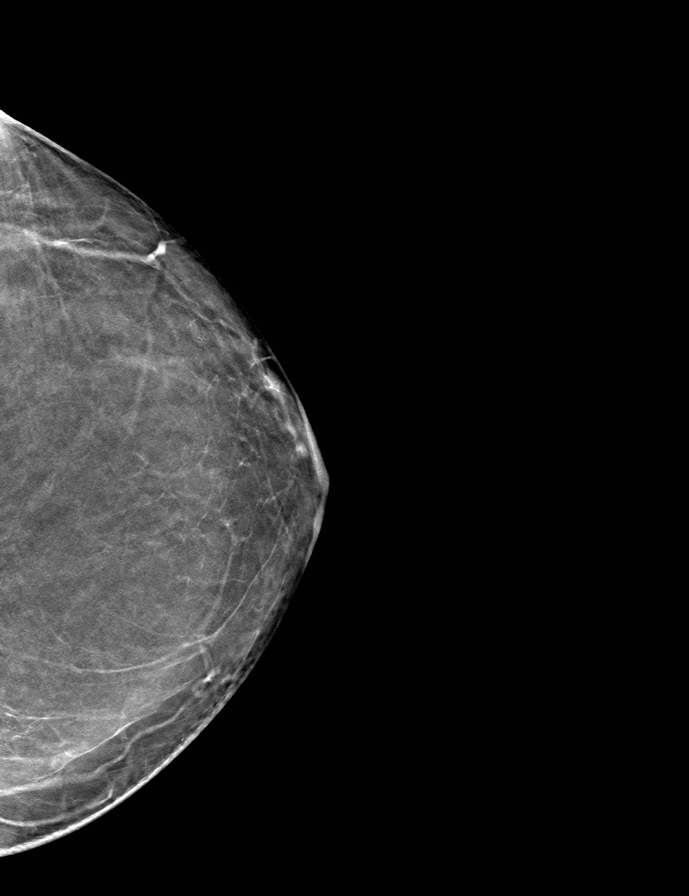

[L MLO tomo · tomo slice 31/62.0]
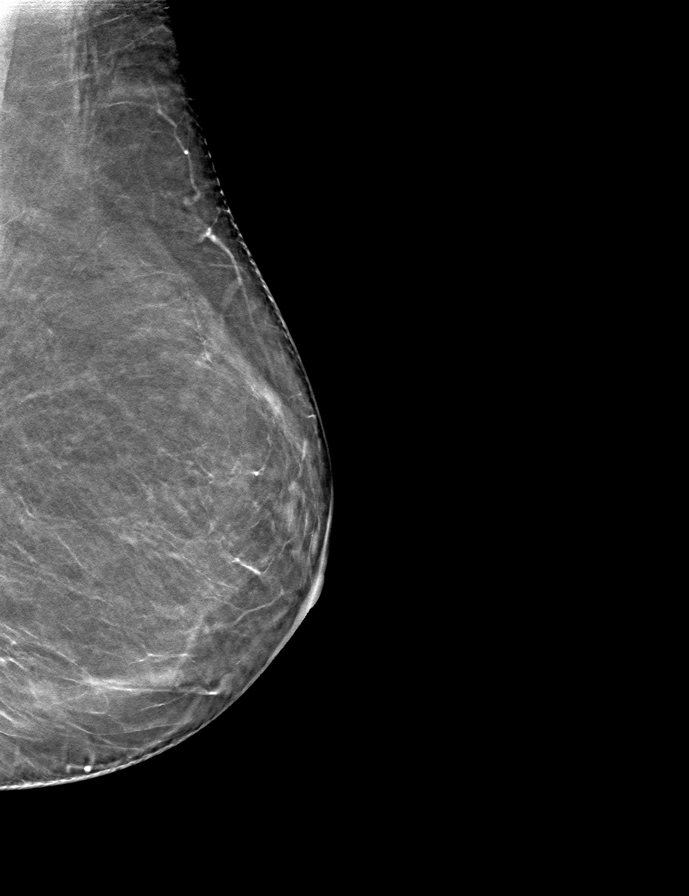

[9 of 24 positions shown; findings below may reference images not displayed]

ACR Breast Density Category b: There are scattered areas of
fibroglandular density.
FINDINGS: There are no findings suspicious for malignancy.
IMPRESSION: No mammographic evidence of malignancy. A result letter of this
screening mammogram will be mailed directly to the patient.

RECOMMENDATION:
Screening mammogram in one year. (Code:51-O-LD2)

BI-RADS CATEGORY  1: Negative.

## 2022-12-23 DIAGNOSIS — M25561 Pain in right knee: Secondary | ICD-10-CM | POA: Diagnosis not present

## 2022-12-23 DIAGNOSIS — M25661 Stiffness of right knee, not elsewhere classified: Secondary | ICD-10-CM | POA: Diagnosis not present

## 2023-01-05 DIAGNOSIS — M25561 Pain in right knee: Secondary | ICD-10-CM | POA: Diagnosis not present

## 2023-01-05 DIAGNOSIS — M25661 Stiffness of right knee, not elsewhere classified: Secondary | ICD-10-CM | POA: Diagnosis not present

## 2023-01-07 DIAGNOSIS — M25661 Stiffness of right knee, not elsewhere classified: Secondary | ICD-10-CM | POA: Diagnosis not present

## 2023-01-07 DIAGNOSIS — M25561 Pain in right knee: Secondary | ICD-10-CM | POA: Diagnosis not present

## 2023-01-12 ENCOUNTER — Encounter (HOSPITAL_COMMUNITY): Payer: Self-pay | Admitting: *Deleted

## 2023-01-12 ENCOUNTER — Emergency Department (HOSPITAL_COMMUNITY): Payer: Medicare PPO

## 2023-01-12 ENCOUNTER — Other Ambulatory Visit: Payer: Self-pay

## 2023-01-12 ENCOUNTER — Emergency Department (HOSPITAL_COMMUNITY)
Admission: EM | Admit: 2023-01-12 | Discharge: 2023-01-13 | Disposition: A | Discharge status: Home / Self Care | Payer: Medicare PPO | Attending: Emergency Medicine | Admitting: Emergency Medicine

## 2023-01-12 DIAGNOSIS — S0990XA Unspecified injury of head, initial encounter: Secondary | ICD-10-CM | POA: Diagnosis not present

## 2023-01-12 DIAGNOSIS — S43014A Anterior dislocation of right humerus, initial encounter: Secondary | ICD-10-CM | POA: Diagnosis not present

## 2023-01-12 DIAGNOSIS — S4982XA Other specified injuries of left shoulder and upper arm, initial encounter: Secondary | ICD-10-CM | POA: Diagnosis not present

## 2023-01-12 DIAGNOSIS — S43004D Unspecified dislocation of right shoulder joint, subsequent encounter: Secondary | ICD-10-CM | POA: Diagnosis not present

## 2023-01-12 DIAGNOSIS — I672 Cerebral atherosclerosis: Secondary | ICD-10-CM | POA: Diagnosis not present

## 2023-01-12 DIAGNOSIS — M25511 Pain in right shoulder: Secondary | ICD-10-CM | POA: Diagnosis present

## 2023-01-12 DIAGNOSIS — Y92000 Kitchen of unspecified non-institutional (private) residence as  the place of occurrence of the external cause: Secondary | ICD-10-CM | POA: Insufficient documentation

## 2023-01-12 DIAGNOSIS — W010XXA Fall on same level from slipping, tripping and stumbling without subsequent striking against object, initial encounter: Secondary | ICD-10-CM | POA: Diagnosis not present

## 2023-01-12 DIAGNOSIS — I10 Essential (primary) hypertension: Secondary | ICD-10-CM | POA: Insufficient documentation

## 2023-01-12 DIAGNOSIS — Y93G9 Activity, other involving cooking and grilling: Secondary | ICD-10-CM | POA: Diagnosis not present

## 2023-01-12 DIAGNOSIS — S43004A Unspecified dislocation of right shoulder joint, initial encounter: Secondary | ICD-10-CM

## 2023-01-12 DIAGNOSIS — S4981XA Other specified injuries of right shoulder and upper arm, initial encounter: Secondary | ICD-10-CM | POA: Diagnosis not present

## 2023-01-12 DIAGNOSIS — Z043 Encounter for examination and observation following other accident: Secondary | ICD-10-CM | POA: Diagnosis not present

## 2023-01-12 DIAGNOSIS — W19XXXA Unspecified fall, initial encounter: Secondary | ICD-10-CM | POA: Diagnosis not present

## 2023-01-12 MED ORDER — ONDANSETRON HCL 4 MG/2ML IJ SOLN
4.0000 mg | Freq: Once | INTRAMUSCULAR | Status: AC
  Administered 2023-01-12: 4 mg via INTRAVENOUS
  Filled 2023-01-12: qty 2

## 2023-01-12 MED ORDER — FENTANYL CITRATE PF 50 MCG/ML IJ SOSY
50.0000 ug | PREFILLED_SYRINGE | INTRAMUSCULAR | Status: DC | PRN
  Administered 2023-01-12: 50 ug via INTRAVENOUS
  Filled 2023-01-12: qty 1

## 2023-01-12 MED ORDER — LIDOCAINE HCL (PF) 1 % IJ SOLN
30.0000 mL | Freq: Once | INTRAMUSCULAR | Status: AC
  Administered 2023-01-12: 30 mL
  Filled 2023-01-12: qty 30

## 2023-01-12 MED ORDER — MORPHINE SULFATE (PF) 4 MG/ML IV SOLN
4.0000 mg | Freq: Once | INTRAVENOUS | Status: AC
  Administered 2023-01-12: 4 mg via INTRAVENOUS
  Filled 2023-01-12: qty 1

## 2023-01-12 MED ORDER — FENTANYL CITRATE PF 50 MCG/ML IJ SOSY
50.0000 ug | PREFILLED_SYRINGE | Freq: Once | INTRAMUSCULAR | Status: AC
  Administered 2023-01-12: 50 ug via INTRAVENOUS
  Filled 2023-01-12: qty 1

## 2023-01-12 MED ORDER — OXYCODONE-ACETAMINOPHEN 5-325 MG PO TABS
1.0000 | ORAL_TABLET | Freq: Once | ORAL | Status: AC
  Administered 2023-01-12: 1 via ORAL
  Filled 2023-01-12: qty 1

## 2023-01-12 NOTE — ED Triage Notes (Signed)
Pt from home with EMS post--fall. Pt lost her balance and fell hitting the handle to the stove. C/o R shoulder pain radiating into upper back. Denies hitting her head. Noted to have L ankle swelling, pt reports in baseline (L knee replacement in November)

## 2023-01-13 ENCOUNTER — Emergency Department (HOSPITAL_COMMUNITY): Payer: Medicare PPO

## 2023-01-13 DIAGNOSIS — Z043 Encounter for examination and observation following other accident: Secondary | ICD-10-CM | POA: Diagnosis not present

## 2023-01-13 DIAGNOSIS — S0990XA Unspecified injury of head, initial encounter: Secondary | ICD-10-CM | POA: Diagnosis not present

## 2023-01-13 DIAGNOSIS — S43004D Unspecified dislocation of right shoulder joint, subsequent encounter: Secondary | ICD-10-CM | POA: Diagnosis not present

## 2023-01-13 NOTE — ED Provider Notes (Signed)
[place] AT [facility] Provider Note   CSN: [id number] Arrival date & time: 01/12/23  2125     History  Chief Complaint  Patient presents with   Tina Price    Tina Price is a 82 y.o. female.  Patient is an 82 year old female with a past medical history of hypertension and dysphonia presenting to the emergency department after a fall.  Patient states that she was in her kitchen doing the dishes when she slipped and fell.  She states that she hit her right shoulder on the oven on her way down.  She states that she does not think that she hit her head and denies loss of consciousness. She states that she is having pain in her right arm and some mild pain in the right side of her neck.  She denies any numbness or weakness.  She states that she was unable to get herself up.  She denies any chest pain, shortness of breath or lightheadedness prior to the fall.  The history is provided by the patient.  Fall       Home Medications Prior to Admission medications   Medication Sig Start Date End Date Taking? Authorizing Provider  acetaminophen (TYLENOL) 500 MG tablet Take 2 tablets (1,000 mg total) by mouth every 6 (six) hours. 09/10/22   Irving Copas, PA-C  AZO-CRANBERRY PO Take 1 tablet by mouth daily.    [provider]  buPROPion (WELLBUTRIN XL) 150 MG 24 hr tablet Take 150 mg by mouth every morning. 05/10/21   [provider]  cephALEXin (KEFLEX) 250 MG capsule Take 250 mg by mouth daily. 08/18/22   [provider]  hydrochlorothiazide (MICROZIDE) 12.5 MG capsule Take 12.5 mg by mouth daily.    [provider]  HYDROmorphone (DILAUDID) 2 MG tablet Take 1 tablet (2 mg total) by mouth every 4 (four) hours as needed. Take 1 tablet by mouth every 4-6 hours as needed for severe pain 09/17/22   Irving Copas, PA-C  methocarbamol (ROBAXIN) 500 MG tablet Take 1 tablet (500 mg total) by mouth every 6 (six) hours as  needed for muscle spasms. 09/10/22   Irving Copas, PA-C  MYRBETRIQ 50 MG TB24 tablet Take 50 mg by mouth daily. 02/20/21   [provider]  polyethylene glycol (MIRALAX / GLYCOLAX) 17 g packet Take 17 g by mouth 2 (two) times daily. 09/10/22   Irving Copas, PA-C  sertraline (ZOLOFT) 50 MG tablet Take 50 mg by mouth daily. 02/13/20   [provider]  simvastatin (ZOCOR) 20 MG tablet Take 20 mg by mouth at bedtime.    [provider]  topiramate (TOPAMAX) 25 MG tablet Take 50 mg by mouth at bedtime as needed (vertigo).    [provider]  verapamil (CALAN-SR) 240 MG CR tablet Take 240 mg by mouth at bedtime.    [provider]      Allergies    Doxycycline, Penicillins, and Sulfa antibiotics    Review of Systems   Review of Systems  Physical Exam Updated Vital Signs BP (!) 173/66   Pulse 60   Temp 97.6 F (36.4 C)   Resp (!) 22   Ht 5' (1.524 m)   Wt 63.5 kg   SpO2 98%   BMI 27.34 kg/m  Physical Exam Vitals and nursing note reviewed.  Constitutional:      Appearance: Normal appearance.     Comments: Mildly uncomfortable appearing  HENT:  Head: Normocephalic and atraumatic.     Nose: Nose normal.     Mouth/Throat:     Mouth: Mucous membranes are moist.     Pharynx: Oropharynx is clear.  Eyes:     Extraocular Movements: Extraocular movements intact.     Conjunctiva/sclera: Conjunctivae normal.  Neck:     Comments: No midline neck tenderness, mild r-sided paraspinal muscle tenderness to palpation Cardiovascular:     Rate and Rhythm: Normal rate and regular rhythm.     Pulses: Normal pulses.     Heart sounds: Normal heart sounds.  Pulmonary:     Effort: Pulmonary effort is normal.     Breath sounds: Normal breath sounds.  Abdominal:     General: Abdomen is flat.     Palpations: Abdomen is soft.     Tenderness: There is no abdominal tenderness.  Musculoskeletal:     Cervical back: Normal range of motion and neck  supple.     Comments: No midline back tenderness to palpation Tenderness to palpation of right shoulder and upper arm with palpable deformity, increased pain with right elbow supination and elbow extension, mild tenderness to palpation of right hand No bony tenderness to left upper extremity or bilateral lower extremities Pelvis stable, nontender  Skin:    General: Skin is warm and dry.  Neurological:     General: No focal deficit present.     Mental Status: She is alert and oriented to person, place, and time.     Sensory: No sensory deficit.     Motor: No weakness.  Psychiatric:        Mood and Affect: Mood normal.        Behavior: Behavior normal.     ED Results / Procedures / Treatments   Labs (all labs ordered are listed, but only abnormal results are displayed) Labs Reviewed - No data to display  EKG None  Radiology DG Shoulder Right  Result Date: 01/12/2023 CLINICAL DATA:  Attempted reduction of prior dislocation EXAM: RIGHT SHOULDER - 2+ VIEW COMPARISON:  01/12/2023 FINDINGS: Frontal and transscapular views of the right shoulder demonstrate persistent anterior glenohumeral dislocation. There are no acute displaced fractures. Well corticated ossific density inferior to the glenoid likely reflects chronic bony Bankart lesion. Soft tissues are unremarkable. The right chest is clear. IMPRESSION: 1. Persistent right anterior glenohumeral dislocation. 2. Chronic appearing bony Bankart lesion. No acute fracture is identified. Electronically Signed   By: Randa Ngo M.D.   On: 01/12/2023 23:50   DG Humerus Right  Result Date: 01/12/2023 CLINICAL DATA:  Trauma to the right upper extremity. EXAM: RIGHT HUMERUS - 2+ VIEW; RIGHT HAND - COMPLETE 3+ VIEW; RIGHT ELBOW - COMPLETE 3+ VIEW COMPARISON:  None Available. FINDINGS: There is anterior dislocation of the right shoulder. An area of irregularity of the inferior rim of the bony glenoid may be chronic or represent a Bankart injury.  Dedicated shoulder radiograph is recommended for better evaluation. No other acute fracture. The bones are osteopenic. The soft tissues are unremarkable. IMPRESSION: 1. Anterior dislocation of the right shoulder. 2. Chronic changes versus less likely Bankart injury of the bony glenoid. Dedicated shoulder radiograph is recommended for better evaluation. Electronically Signed   By: Anner Crete M.D.   On: 01/12/2023 22:27   DG Elbow Complete Right  Result Date: 01/12/2023 CLINICAL DATA:  Trauma to the right upper extremity. EXAM: RIGHT HUMERUS - 2+ VIEW; RIGHT HAND - COMPLETE 3+ VIEW; RIGHT ELBOW - COMPLETE 3+ VIEW COMPARISON:  None  Available. FINDINGS: There is anterior dislocation of the right shoulder. An area of irregularity of the inferior rim of the bony glenoid may be chronic or represent a Bankart injury. Dedicated shoulder radiograph is recommended for better evaluation. No other acute fracture. The bones are osteopenic. The soft tissues are unremarkable. IMPRESSION: 1. Anterior dislocation of the right shoulder. 2. Chronic changes versus less likely Bankart injury of the bony glenoid. Dedicated shoulder radiograph is recommended for better evaluation. Electronically Signed   By: Anner Crete M.D.   On: 01/12/2023 22:27   DG Hand Complete Right  Result Date: 01/12/2023 CLINICAL DATA:  Trauma to the right upper extremity. EXAM: RIGHT HUMERUS - 2+ VIEW; RIGHT HAND - COMPLETE 3+ VIEW; RIGHT ELBOW - COMPLETE 3+ VIEW COMPARISON:  None Available. FINDINGS: There is anterior dislocation of the right shoulder. An area of irregularity of the inferior rim of the bony glenoid may be chronic or represent a Bankart injury. Dedicated shoulder radiograph is recommended for better evaluation. No other acute fracture. The bones are osteopenic. The soft tissues are unremarkable. IMPRESSION: 1. Anterior dislocation of the right shoulder. 2. Chronic changes versus less likely Bankart injury of the bony glenoid.  Dedicated shoulder radiograph is recommended for better evaluation. Electronically Signed   By: Anner Crete M.D.   On: 01/12/2023 22:27    Procedures .Ortho Injury Treatment  Date/Time: 01/13/2023 12:04 AM  Performed by: Kemper Durie, DO Authorized by: Kemper Durie, DO   Consent:    Consent obtained:  Verbal   Consent given by:  Patient   Risks discussed:  Fracture, irreducible dislocation, nerve damage, recurrent dislocation, restricted joint movement, stiffness and vascular damage   Alternatives discussed:  No treatment and delayed treatmentInjury location: shoulder Location details: right shoulder Injury type: dislocation Dislocation type: anterior Hill-Sachs deformity: no Chronicity: new Pre-procedure neurovascular assessment: neurovascularly intact Pre-procedure distal perfusion: normal Pre-procedure neurological function: normal Pre-procedure range of motion: reduced Anesthesia: local infiltration  Anesthesia: Local anesthesia used: yes Local Anesthetic: lidocaine 1% without epinephrine Anesthetic total: 20 mL  Patient sedated: NoManipulation performed: yes Reduction method: external rotation and traction and counter traction Reduction successful: yes X-ray confirmed reduction: yes Immobilization: sling Splint Applied by: ED Provider Post-procedure neurovascular assessment: post-procedure neurovascularly intact Post-procedure distal perfusion: normal Post-procedure neurological function: normal Post-procedure range of motion: improved Comments: Two attempts required, successful reduction on second attempt       Medications Ordered in ED Medications  fentaNYL (SUBLIMAZE) injection 50 mcg (50 mcg Intravenous Given 01/12/23 2156)  ondansetron (ZOFRAN) injection 4 mg (4 mg Intravenous Given 01/12/23 2157)  morphine (PF) 4 MG/ML injection 4 mg (4 mg Intravenous Given 01/12/23 2249)  oxyCODONE-acetaminophen (PERCOCET/ROXICET) 5-325 MG per tablet 1  tablet (1 tablet Oral Given 01/12/23 2249)  lidocaine (PF) (XYLOCAINE) 1 % injection 30 mL (30 mLs Other Given 01/12/23 2300)  fentaNYL (SUBLIMAZE) injection 50 mcg (50 mcg Intravenous Given 01/12/23 2315)    ED Course/ Medical Decision Making/ A&P Clinical Course as of 01/13/23 0019  Tue Jan 13, 2023  0019 Patient signed out to Dr. Leonette Monarch pending CT imaging and reassessment. [VK]    Clinical Course User Index [VK] Kemper Durie, DO                             Medical Decision Making This patient presents to the ED with chief complaint(s) of fall with pertinent past medical history of HTN, dysphonia which further complicates  the presenting complaint. The complaint involves an extensive differential diagnosis and also carries with it a high risk of complications and morbidity.    The differential diagnosis includes ICH, mass effect, cervical spine fracture, shoulder fracture or dislocation, elbow fracture dislocation, fracture or sprain of right hand, no other traumatic injuries seen on exam, no presyncopal symptoms making syncopal fall unlikely  Additional history obtained: Additional history obtained from N/A Records reviewed Primary Care Documents  ED Course and Reassessment: On patient's arrival to the emergency department she is hemodynamically stable no acute distress.  She is uncomfortable appearing complaining of pain mostly in her right arm.  Patient will have x-rays performed in addition to head CT and C-spine in the setting of her neck pain and her advanced age and her fall.  She will be given pain control and closely reassess.  Independent labs interpretation:  N/A  Independent visualization of imaging: - I independently visualized the following imaging with scope of interpretation limited to determining acute life threatening conditions related to emergency care: R shoulder, R elbow and R hand XR, which revealed anterior R shoulder dislocation      Amount and/or  Complexity of Data Reviewed Radiology: ordered.  Risk Prescription drug management.          Final Clinical Impression(s) / ED Diagnoses Final diagnoses:  Fall, initial encounter  Dislocation of right shoulder joint, initial encounter    Rx / DC Orders ED Discharge Orders     None         Kemper Durie, DO 01/13/23 0019

## 2023-01-13 NOTE — Discharge Instructions (Addendum)
For pain control you may take 1000 mg of Tylenol every 8 hours scheduled.

## 2023-01-13 NOTE — ED Notes (Signed)
Pt ambulated well at baseline. 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

## 2023-01-13 NOTE — ED Provider Notes (Signed)
I assumed care of this patient.  Please see previous provider's note for further details of history, exam, and MDM.  Briefly patient is a 82 y.o. female who presented after a fall resulting in right shoulder dislocation status post reduction and currently pending CT head and cervical spine.  Imaging negative for any acute injuries. Patient was provided with a sling Ambulated with cane. Patient reports that she lives alone but has a friend that will be staying with her.  The patient appears reasonably screened and/or stabilized for discharge and I doubt any other medical condition or other [facility] requiring further screening, evaluation, or treatment in the ED at this time. I have discussed the findings, Dx and Tx plan with the patient/family who expressed understanding and agree(s) with the plan. Discharge instructions discussed at length. The patient/family was given strict return precautions who verbalized understanding of the instructions. No further questions at time of discharge.  Disposition: Discharge  Condition: Good  ED Discharge Orders     None        Follow Up: Lawerance Cruel, MD [place] [id number] [phone]  Call  to schedule an appointment for close follow up  [address], [place] [address] [phone]  Call  to schedule an appointment for close follow up         Kitai Purdom, Grayce Sessions, MD 01/13/23 [phone]

## 2023-01-27 DIAGNOSIS — M25511 Pain in right shoulder: Secondary | ICD-10-CM | POA: Diagnosis not present

## 2023-01-27 DIAGNOSIS — M67911 Unspecified disorder of synovium and tendon, right shoulder: Secondary | ICD-10-CM | POA: Diagnosis not present

## 2023-02-09 ENCOUNTER — Other Ambulatory Visit: Payer: Self-pay | Admitting: Family Medicine

## 2023-02-09 DIAGNOSIS — Z1231 Encounter for screening mammogram for malignant neoplasm of breast: Secondary | ICD-10-CM

## 2023-02-10 DIAGNOSIS — S43014A Anterior dislocation of right humerus, initial encounter: Secondary | ICD-10-CM | POA: Diagnosis not present

## 2023-02-10 DIAGNOSIS — M25511 Pain in right shoulder: Secondary | ICD-10-CM | POA: Diagnosis not present

## 2023-02-13 DIAGNOSIS — M25511 Pain in right shoulder: Secondary | ICD-10-CM | POA: Diagnosis not present

## 2023-02-17 DIAGNOSIS — M25511 Pain in right shoulder: Secondary | ICD-10-CM | POA: Diagnosis not present

## 2023-02-24 DIAGNOSIS — M25511 Pain in right shoulder: Secondary | ICD-10-CM | POA: Diagnosis not present

## 2023-02-27 DIAGNOSIS — M25511 Pain in right shoulder: Secondary | ICD-10-CM | POA: Diagnosis not present

## 2023-03-10 DIAGNOSIS — M25511 Pain in right shoulder: Secondary | ICD-10-CM | POA: Diagnosis not present

## 2023-03-12 DIAGNOSIS — M25511 Pain in right shoulder: Secondary | ICD-10-CM | POA: Diagnosis not present

## 2023-03-13 DIAGNOSIS — I10 Essential (primary) hypertension: Secondary | ICD-10-CM | POA: Diagnosis not present

## 2023-03-13 DIAGNOSIS — E7800 Pure hypercholesterolemia, unspecified: Secondary | ICD-10-CM | POA: Diagnosis not present

## 2023-03-17 DIAGNOSIS — M25511 Pain in right shoulder: Secondary | ICD-10-CM | POA: Diagnosis not present

## 2023-03-18 DIAGNOSIS — F324 Major depressive disorder, single episode, in partial remission: Secondary | ICD-10-CM | POA: Diagnosis not present

## 2023-03-18 DIAGNOSIS — J383 Other diseases of vocal cords: Secondary | ICD-10-CM | POA: Diagnosis not present

## 2023-03-18 DIAGNOSIS — N1831 Chronic kidney disease, stage 3a: Secondary | ICD-10-CM | POA: Diagnosis not present

## 2023-03-18 DIAGNOSIS — Z0000 Encounter for general adult medical examination without abnormal findings: Secondary | ICD-10-CM | POA: Diagnosis not present

## 2023-03-18 DIAGNOSIS — K219 Gastro-esophageal reflux disease without esophagitis: Secondary | ICD-10-CM | POA: Diagnosis not present

## 2023-03-18 DIAGNOSIS — G43909 Migraine, unspecified, not intractable, without status migrainosus: Secondary | ICD-10-CM | POA: Diagnosis not present

## 2023-03-18 DIAGNOSIS — Z6828 Body mass index (BMI) 28.0-28.9, adult: Secondary | ICD-10-CM | POA: Diagnosis not present

## 2023-03-18 DIAGNOSIS — E7800 Pure hypercholesterolemia, unspecified: Secondary | ICD-10-CM | POA: Diagnosis not present

## 2023-03-18 DIAGNOSIS — I10 Essential (primary) hypertension: Secondary | ICD-10-CM | POA: Diagnosis not present

## 2023-03-19 DIAGNOSIS — M25511 Pain in right shoulder: Secondary | ICD-10-CM | POA: Diagnosis not present

## 2023-03-25 DIAGNOSIS — M25511 Pain in right shoulder: Secondary | ICD-10-CM | POA: Diagnosis not present

## 2023-03-26 DIAGNOSIS — M25511 Pain in right shoulder: Secondary | ICD-10-CM | POA: Diagnosis not present

## 2023-03-30 DIAGNOSIS — N3281 Overactive bladder: Secondary | ICD-10-CM | POA: Diagnosis not present

## 2023-03-30 DIAGNOSIS — N3020 Other chronic cystitis without hematuria: Secondary | ICD-10-CM | POA: Diagnosis not present

## 2023-03-31 DIAGNOSIS — M25511 Pain in right shoulder: Secondary | ICD-10-CM | POA: Diagnosis not present

## 2023-04-07 DIAGNOSIS — M25511 Pain in right shoulder: Secondary | ICD-10-CM | POA: Diagnosis not present

## 2023-04-09 DIAGNOSIS — M25511 Pain in right shoulder: Secondary | ICD-10-CM | POA: Diagnosis not present

## 2023-04-21 DIAGNOSIS — M25511 Pain in right shoulder: Secondary | ICD-10-CM | POA: Diagnosis not present

## 2023-04-23 DIAGNOSIS — M25511 Pain in right shoulder: Secondary | ICD-10-CM | POA: Diagnosis not present

## 2023-05-05 DIAGNOSIS — M25511 Pain in right shoulder: Secondary | ICD-10-CM | POA: Diagnosis not present

## 2023-05-07 DIAGNOSIS — M25511 Pain in right shoulder: Secondary | ICD-10-CM | POA: Diagnosis not present

## 2023-05-08 DIAGNOSIS — J385 Laryngeal spasm: Secondary | ICD-10-CM | POA: Diagnosis not present

## 2023-05-12 DIAGNOSIS — M25511 Pain in right shoulder: Secondary | ICD-10-CM | POA: Diagnosis not present

## 2023-05-15 DIAGNOSIS — M25511 Pain in right shoulder: Secondary | ICD-10-CM | POA: Diagnosis not present

## 2023-05-19 DIAGNOSIS — M25511 Pain in right shoulder: Secondary | ICD-10-CM | POA: Diagnosis not present

## 2023-05-20 ENCOUNTER — Ambulatory Visit: Payer: Medicare PPO

## 2023-05-22 DIAGNOSIS — M25511 Pain in right shoulder: Secondary | ICD-10-CM | POA: Diagnosis not present

## 2023-05-25 DIAGNOSIS — M25511 Pain in right shoulder: Secondary | ICD-10-CM | POA: Diagnosis not present

## 2023-06-02 DIAGNOSIS — M25511 Pain in right shoulder: Secondary | ICD-10-CM | POA: Diagnosis not present

## 2023-06-04 DIAGNOSIS — M25511 Pain in right shoulder: Secondary | ICD-10-CM | POA: Diagnosis not present

## 2023-06-08 DIAGNOSIS — M25511 Pain in right shoulder: Secondary | ICD-10-CM | POA: Diagnosis not present

## 2023-06-17 ENCOUNTER — Ambulatory Visit: Payer: Medicare PPO

## 2023-06-18 DIAGNOSIS — M25511 Pain in right shoulder: Secondary | ICD-10-CM | POA: Diagnosis not present

## 2023-06-22 DIAGNOSIS — M25511 Pain in right shoulder: Secondary | ICD-10-CM | POA: Diagnosis not present

## 2023-07-08 DIAGNOSIS — M25511 Pain in right shoulder: Secondary | ICD-10-CM | POA: Diagnosis not present

## 2023-07-13 ENCOUNTER — Emergency Department (HOSPITAL_COMMUNITY)
Admission: EM | Admit: 2023-07-13 | Discharge: 2023-07-13 | Disposition: A | Discharge status: Home / Self Care | Payer: Medicare PPO | Attending: Emergency Medicine | Admitting: Emergency Medicine

## 2023-07-13 ENCOUNTER — Other Ambulatory Visit: Payer: Self-pay

## 2023-07-13 ENCOUNTER — Emergency Department (HOSPITAL_COMMUNITY): Payer: Medicare PPO

## 2023-07-13 ENCOUNTER — Encounter (HOSPITAL_COMMUNITY): Payer: Self-pay

## 2023-07-13 DIAGNOSIS — M19011 Primary osteoarthritis, right shoulder: Secondary | ICD-10-CM | POA: Diagnosis not present

## 2023-07-13 DIAGNOSIS — S0101XA Laceration without foreign body of scalp, initial encounter: Secondary | ICD-10-CM | POA: Diagnosis not present

## 2023-07-13 DIAGNOSIS — W19XXXA Unspecified fall, initial encounter: Secondary | ICD-10-CM

## 2023-07-13 DIAGNOSIS — S0990XA Unspecified injury of head, initial encounter: Secondary | ICD-10-CM | POA: Diagnosis not present

## 2023-07-13 DIAGNOSIS — I10 Essential (primary) hypertension: Secondary | ICD-10-CM | POA: Insufficient documentation

## 2023-07-13 DIAGNOSIS — Y92002 Bathroom of unspecified non-institutional (private) residence single-family (private) house as the place of occurrence of the external cause: Secondary | ICD-10-CM | POA: Insufficient documentation

## 2023-07-13 DIAGNOSIS — M19012 Primary osteoarthritis, left shoulder: Secondary | ICD-10-CM | POA: Diagnosis not present

## 2023-07-13 DIAGNOSIS — W01198A Fall on same level from slipping, tripping and stumbling with subsequent striking against other object, initial encounter: Secondary | ICD-10-CM | POA: Diagnosis not present

## 2023-07-13 DIAGNOSIS — F0390 Unspecified dementia without behavioral disturbance: Secondary | ICD-10-CM | POA: Diagnosis not present

## 2023-07-13 DIAGNOSIS — M25511 Pain in right shoulder: Secondary | ICD-10-CM | POA: Diagnosis not present

## 2023-07-13 DIAGNOSIS — S199XXA Unspecified injury of neck, initial encounter: Secondary | ICD-10-CM | POA: Diagnosis not present

## 2023-07-13 DIAGNOSIS — M25711 Osteophyte, right shoulder: Secondary | ICD-10-CM | POA: Diagnosis not present

## 2023-07-13 DIAGNOSIS — S22000A Wedge compression fracture of unspecified thoracic vertebra, initial encounter for closed fracture: Secondary | ICD-10-CM | POA: Diagnosis not present

## 2023-07-13 DIAGNOSIS — I700 Atherosclerosis of aorta: Secondary | ICD-10-CM | POA: Diagnosis not present

## 2023-07-13 DIAGNOSIS — S3992XA Unspecified injury of lower back, initial encounter: Secondary | ICD-10-CM | POA: Diagnosis not present

## 2023-07-13 DIAGNOSIS — M25512 Pain in left shoulder: Secondary | ICD-10-CM | POA: Diagnosis not present

## 2023-07-13 DIAGNOSIS — S22080A Wedge compression fracture of T11-T12 vertebra, initial encounter for closed fracture: Secondary | ICD-10-CM | POA: Diagnosis not present

## 2023-07-13 MED ORDER — OXYCODONE-ACETAMINOPHEN 5-325 MG PO TABS: 1.0000 | ORAL_TABLET | Freq: Three times a day (TID) | ORAL | 0 refills | Status: AC | PRN

## 2023-07-13 MED ORDER — ACETAMINOPHEN 325 MG PO TABS
650.0000 mg | ORAL_TABLET | Freq: Once | ORAL | Status: AC
  Administered 2023-07-13: 650 mg via ORAL
  Filled 2023-07-13: qty 2

## 2023-07-13 MED ORDER — TRAMADOL HCL 50 MG PO TABS: 50.0000 mg | ORAL_TABLET | Freq: Once | ORAL | Status: DC

## 2023-07-13 MED ORDER — OXYCODONE-ACETAMINOPHEN 5-325 MG PO TABS
1.0000 | ORAL_TABLET | Freq: Once | ORAL | Status: AC
  Administered 2023-07-13: 1 via ORAL
  Filled 2023-07-13: qty 1

## 2023-07-13 NOTE — Progress Notes (Signed)
Orthopedic Tech Progress Note Patient Details:  Tina Price [birth year] [id number]  Ortho Devices Type of Ortho Device: Thoracolumbar corset (TLSO) Ortho Device/Splint Interventions: Ordered, Application, Adjustment   Post Interventions Patient Tolerated: Well Instructions Provided: Care of device, Adjustment of device  [place] A Jimel Myler 07/13/2023, 4:21 PM

## 2023-07-13 NOTE — ED Notes (Signed)
Cleaned abrasion to posterior head with normal saline

## 2023-07-13 NOTE — ED Triage Notes (Signed)
BIBA from home for a mechanical fall in the bathroom, striking the back of her head on bath tub. No thinners, no LOC.Pt laid in floor for about 1 1/2 hours before being able to get help.

## 2023-07-13 NOTE — Discharge Instructions (Addendum)
It was a pleasure caring for you today.  As discussed, you will need to call Doran Durand, NP with [facility] an ask for an outpatient appointment. Please also follow up with your primary care provider. Seek emergency care if experiencing any new or worsening symptoms.  Alternating between 650 mg Tylenol and 400 mg Advil: The best way to alternate taking Acetaminophen (example Tylenol) and Ibuprofen (example Advil/Motrin) is to take them 3 hours apart. For example, if you take ibuprofen at 6 am you can then take Tylenol at 9 am. You can continue this regimen throughout the day, making sure you do not exceed the recommended maximum dose for each drug.

## 2023-07-13 NOTE — ED Notes (Signed)
Ortho tech called for TLSO brace

## 2023-07-13 NOTE — ED Provider Notes (Signed)
[place] EMERGENCY DEPARTMENT AT [facility] Provider Note   CSN: [id number] Arrival date & time: 07/13/23  1116     History  Chief Complaint  Patient presents with   Marletta Lor    Tina Price is a 82 y.o. female with PMHx arthritis, scoliosis, dementia, headache, HTN who presents to ED concerned for mechanical fall. Patient was at the sink in her bathroom when she turned, tripped, and fell - hitting her head on the bathtub. Patient stating that she crawled to find her phone on her bedside table. Patient called her friend who then called EMS.  Patient currently concerned for BL shoulder pain and left sided head pain. States that she had a fall 6 months ago that resulted in a dislocated right shoulder. Has been in Physical Therapy for right shoulder. Denies blood thinners. Denies LOC and seizure.  Denies nausea, vomiting, chest pain, dyspnea.   Fall       Home Medications Prior to Admission medications   Medication Sig Start Date End Date Taking? Authorizing Provider  oxyCODONE-acetaminophen (PERCOCET/ROXICET) 5-325 MG tablet Take 1 tablet by mouth every 8 (eight) hours as needed for up to 2 days for severe pain. 07/13/23 07/15/23 Yes Valrie Hart F, PA-C  acetaminophen (TYLENOL) 500 MG tablet Take 2 tablets (1,000 mg total) by mouth every 6 (six) hours. Patient taking differently: Take 1,000 mg by mouth every 6 (six) hours as needed for moderate pain. 09/10/22   Cassandria Anger, PA-C  buPROPion (WELLBUTRIN XL) 300 MG 24 hr tablet Take 300 mg by mouth daily.    [provider]  cephALEXin (KEFLEX) 250 MG capsule Take 250 mg by mouth every evening. 08/18/22   [provider]  hydrochlorothiazide (MICROZIDE) 12.5 MG capsule Take 12.5 mg by mouth daily.    [provider]  HYDROmorphone (DILAUDID) 2 MG tablet Take 1 tablet (2 mg total) by mouth every 4 (four) hours as needed. Take 1 tablet by mouth every 4-6 hours as needed for severe  pain Patient not taking: Reported on 01/13/2023 09/17/22   Cassandria Anger, PA-C  methocarbamol (ROBAXIN) 500 MG tablet Take 1 tablet (500 mg total) by mouth every 6 (six) hours as needed for muscle spasms. Patient not taking: Reported on 01/13/2023 09/10/22   Cassandria Anger, PA-C  MYRBETRIQ 50 MG TB24 tablet Take 50 mg by mouth daily. 02/20/21   [provider]  polyethylene glycol (MIRALAX / GLYCOLAX) 17 g packet Take 17 g by mouth 2 (two) times daily. Patient not taking: Reported on 01/13/2023 09/10/22   Cassandria Anger, PA-C  sertraline (ZOLOFT) 50 MG tablet Take 50 mg by mouth daily. 02/13/20   [provider]  simvastatin (ZOCOR) 20 MG tablet Take 20 mg by mouth at bedtime.    [provider]  topiramate (TOPAMAX) 25 MG tablet Take 50 mg by mouth at bedtime as needed (vertigo).    [provider]  verapamil (CALAN-SR) 240 MG CR tablet Take 240 mg by mouth daily.    [provider]      Allergies    Doxycycline, Penicillins, and Sulfa antibiotics    Review of Systems   Review of Systems  Neurological:        Fall    Physical Exam Updated Vital Signs BP 128/60   Pulse 77   Temp 98 F (36.7 C)   Resp 17   SpO2 100%  Physical Exam Vitals and nursing note reviewed.  Constitutional:  General: She is not in acute distress.    Appearance: She is not ill-appearing or toxic-appearing.  HENT:     Head: Normocephalic and atraumatic.     Mouth/Throat:     Mouth: Mucous membranes are moist.  Eyes:     General: No scleral icterus.       Right eye: No discharge.        Left eye: No discharge.     Conjunctiva/sclera: Conjunctivae normal.  Cardiovascular:     Rate and Rhythm: Normal rate and regular rhythm.     Pulses: Normal pulses.     Heart sounds: Normal heart sounds. No murmur heard. Pulmonary:     Effort: Pulmonary effort is normal. No respiratory distress.     Breath sounds: Normal breath sounds. No wheezing, rhonchi or rales.   Abdominal:     General: Abdomen is flat. Bowel sounds are normal. There is no distension.     Palpations: Abdomen is soft. There is no mass.     Tenderness: There is no abdominal tenderness.  Musculoskeletal:     Right lower leg: No edema.     Left lower leg: No edema.     Comments: No tenderness to palpation of skull, shoulders, elbows, wrists, hips, knees, ankles BL.   Some pain with palpation of thoracic spine  Skin:    General: Skin is warm and dry.     Findings: No rash.     Comments: Small area of dried blood on posterior left side of scalp. When cleared away, there is a <1cm laceration without active bleeding.  Neurological:     General: No focal deficit present.     Mental Status: She is alert. Mental status is at baseline.     Comments: GCS 15. Speech is goal oriented. No deficits appreciated to CN III-XII; symmetric eyebrow raise, no facial drooping, tongue midline. Patient has equal grip strength bilaterally with 5/5 strength against resistance in all major muscle groups bilaterally. Sensation to light touch intact. Patient moves extremities without ataxia.    Psychiatric:        Mood and Affect: Mood normal.        Behavior: Behavior normal.     ED Results / Procedures / Treatments   Labs (all labs ordered are listed, but only abnormal results are displayed) Labs Reviewed - No data to display  EKG None  Radiology CT Head Wo Contrast  Result Date: 07/13/2023 CLINICAL DATA:  Trauma EXAM: CT HEAD WITHOUT CONTRAST CT CERVICAL SPINE WITHOUT CONTRAST CT THORACIC SPINE WITHOUT CONTRAST CT LUMBAR SPINE WITHOUT CONTRAST TECHNIQUE: Multidetector CT imaging of the head, cervical spine, thoracic spine, and lumbar spine was performed following the standard protocol without intravenous contrast. Multiplanar CT image reconstructions of the cervical spine were also generated. RADIATION DOSE REDUCTION: This exam was performed according to the departmental dose-optimization program  which includes automated exposure control, adjustment of the mA and/or kV according to patient size and/or use of iterative reconstruction technique. COMPARISON:  MR L Spine 06/01/22 FINDINGS: CT HEAD FINDINGS Brain: No hemorrhage. No hydrocephalus. No extra-axial fluid collection. No CT evidence of an acute cortical infarct. There is sequela of moderate chronic microvascular ischemic change. Advanced generalized volume loss without lobar predominance. Vascular: No hyperdense vessel or unexpected calcification. Skull: Soft tissue laceration the left parietal scalp. No evidence of underlying calvarial fracture. Sinuses/Orbits: Ear or mastoid effusion. Paranasal sinuses are clear. Bilateral lens replacement. Orbits are otherwise unremarkable. Other: None. CT CERVICAL SPINE FINDINGS Alignment: Rightward  curvature of the cervical spine. Skull base and vertebrae: No acute fracture. No primary bone lesion or focal pathologic process. Soft tissues and spinal canal: No prevertebral fluid or swelling. No visible canal hematoma. Disc levels:  At least moderate spinal canal narrowing at C5-C6. Upper chest: Negative. CT THORACIC SPINE FINDINGS Alignment: Rightward curvature of the thoracic spine. Vertebrae: There is an acute superior endplate compression deformity at T11. Paraspinal and other soft tissues: Small hiatal hernia with circumferential esophageal wall thickening in the distal esophagus. Findings can be seen in the setting of esophagitis. Correlate with symptomatology. Disc levels: No evidence of high-grade spinal canal stenosis CT LUMBAR SPINE FINDINGS Segmentation: 5 lumbar type vertebrae. Alignment: Leftward curvature of the lumbar spine. There is left lateral listhesis of L2 on L3 and L3 on L4. Vertebrae: Status post L3-L5 posterior spinal fusion and decompression. Paraspinal and other soft tissues: Negative. Disc levels: No evidence of high-grade spinal canal stenosis. IMPRESSION: 1. No acute intracranial  abnormality. 2. Soft tissue laceration left parietal scalp. No evidence of underlying calvarial fracture. 3. Acute superior endplate compression deformity at T11. 4. No acute fracture or traumatic malalignment of the cervical or lumbar spine. 5. Small hiatal hernia with circumferential esophageal wall thickening in the distal esophagus. Findings can be seen in the setting of esophagitis. Correlate with symptomatology. Electronically Signed   By: Lorenza Cambridge M.D.   On: 07/13/2023 14:20   CT Cervical Spine Wo Contrast  Result Date: 07/13/2023 CLINICAL DATA:  Trauma EXAM: CT HEAD WITHOUT CONTRAST CT CERVICAL SPINE WITHOUT CONTRAST CT THORACIC SPINE WITHOUT CONTRAST CT LUMBAR SPINE WITHOUT CONTRAST TECHNIQUE: Multidetector CT imaging of the head, cervical spine, thoracic spine, and lumbar spine was performed following the standard protocol without intravenous contrast. Multiplanar CT image reconstructions of the cervical spine were also generated. RADIATION DOSE REDUCTION: This exam was performed according to the departmental dose-optimization program which includes automated exposure control, adjustment of the mA and/or kV according to patient size and/or use of iterative reconstruction technique. COMPARISON:  MR L Spine 06/01/22 FINDINGS: CT HEAD FINDINGS Brain: No hemorrhage. No hydrocephalus. No extra-axial fluid collection. No CT evidence of an acute cortical infarct. There is sequela of moderate chronic microvascular ischemic change. Advanced generalized volume loss without lobar predominance. Vascular: No hyperdense vessel or unexpected calcification. Skull: Soft tissue laceration the left parietal scalp. No evidence of underlying calvarial fracture. Sinuses/Orbits: Ear or mastoid effusion. Paranasal sinuses are clear. Bilateral lens replacement. Orbits are otherwise unremarkable. Other: None. CT CERVICAL SPINE FINDINGS Alignment: Rightward curvature of the cervical spine. Skull base and vertebrae: No acute  fracture. No primary bone lesion or focal pathologic process. Soft tissues and spinal canal: No prevertebral fluid or swelling. No visible canal hematoma. Disc levels:  At least moderate spinal canal narrowing at C5-C6. Upper chest: Negative. CT THORACIC SPINE FINDINGS Alignment: Rightward curvature of the thoracic spine. Vertebrae: There is an acute superior endplate compression deformity at T11. Paraspinal and other soft tissues: Small hiatal hernia with circumferential esophageal wall thickening in the distal esophagus. Findings can be seen in the setting of esophagitis. Correlate with symptomatology. Disc levels: No evidence of high-grade spinal canal stenosis CT LUMBAR SPINE FINDINGS Segmentation: 5 lumbar type vertebrae. Alignment: Leftward curvature of the lumbar spine. There is left lateral listhesis of L2 on L3 and L3 on L4. Vertebrae: Status post L3-L5 posterior spinal fusion and decompression. Paraspinal and other soft tissues: Negative. Disc levels: No evidence of high-grade spinal canal stenosis. IMPRESSION: 1. No acute intracranial abnormality.  2. Soft tissue laceration left parietal scalp. No evidence of underlying calvarial fracture. 3. Acute superior endplate compression deformity at T11. 4. No acute fracture or traumatic malalignment of the cervical or lumbar spine. 5. Small hiatal hernia with circumferential esophageal wall thickening in the distal esophagus. Findings can be seen in the setting of esophagitis. Correlate with symptomatology. Electronically Signed   By: Lorenza Cambridge M.D.   On: 07/13/2023 14:20   CT Thoracic Spine Wo Contrast  Result Date: 07/13/2023 CLINICAL DATA:  Trauma EXAM: CT HEAD WITHOUT CONTRAST CT CERVICAL SPINE WITHOUT CONTRAST CT THORACIC SPINE WITHOUT CONTRAST CT LUMBAR SPINE WITHOUT CONTRAST TECHNIQUE: Multidetector CT imaging of the head, cervical spine, thoracic spine, and lumbar spine was performed following the standard protocol without intravenous contrast.  Multiplanar CT image reconstructions of the cervical spine were also generated. RADIATION DOSE REDUCTION: This exam was performed according to the departmental dose-optimization program which includes automated exposure control, adjustment of the mA and/or kV according to patient size and/or use of iterative reconstruction technique. COMPARISON:  MR L Spine 06/01/22 FINDINGS: CT HEAD FINDINGS Brain: No hemorrhage. No hydrocephalus. No extra-axial fluid collection. No CT evidence of an acute cortical infarct. There is sequela of moderate chronic microvascular ischemic change. Advanced generalized volume loss without lobar predominance. Vascular: No hyperdense vessel or unexpected calcification. Skull: Soft tissue laceration the left parietal scalp. No evidence of underlying calvarial fracture. Sinuses/Orbits: Ear or mastoid effusion. Paranasal sinuses are clear. Bilateral lens replacement. Orbits are otherwise unremarkable. Other: None. CT CERVICAL SPINE FINDINGS Alignment: Rightward curvature of the cervical spine. Skull base and vertebrae: No acute fracture. No primary bone lesion or focal pathologic process. Soft tissues and spinal canal: No prevertebral fluid or swelling. No visible canal hematoma. Disc levels:  At least moderate spinal canal narrowing at C5-C6. Upper chest: Negative. CT THORACIC SPINE FINDINGS Alignment: Rightward curvature of the thoracic spine. Vertebrae: There is an acute superior endplate compression deformity at T11. Paraspinal and other soft tissues: Small hiatal hernia with circumferential esophageal wall thickening in the distal esophagus. Findings can be seen in the setting of esophagitis. Correlate with symptomatology. Disc levels: No evidence of high-grade spinal canal stenosis CT LUMBAR SPINE FINDINGS Segmentation: 5 lumbar type vertebrae. Alignment: Leftward curvature of the lumbar spine. There is left lateral listhesis of L2 on L3 and L3 on L4. Vertebrae: Status post L3-L5 posterior  spinal fusion and decompression. Paraspinal and other soft tissues: Negative. Disc levels: No evidence of high-grade spinal canal stenosis. IMPRESSION: 1. No acute intracranial abnormality. 2. Soft tissue laceration left parietal scalp. No evidence of underlying calvarial fracture. 3. Acute superior endplate compression deformity at T11. 4. No acute fracture or traumatic malalignment of the cervical or lumbar spine. 5. Small hiatal hernia with circumferential esophageal wall thickening in the distal esophagus. Findings can be seen in the setting of esophagitis. Correlate with symptomatology. Electronically Signed   By: Lorenza Cambridge M.D.   On: 07/13/2023 14:20   CT Lumbar Spine Wo Contrast  Result Date: 07/13/2023 CLINICAL DATA:  Trauma EXAM: CT HEAD WITHOUT CONTRAST CT CERVICAL SPINE WITHOUT CONTRAST CT THORACIC SPINE WITHOUT CONTRAST CT LUMBAR SPINE WITHOUT CONTRAST TECHNIQUE: Multidetector CT imaging of the head, cervical spine, thoracic spine, and lumbar spine was performed following the standard protocol without intravenous contrast. Multiplanar CT image reconstructions of the cervical spine were also generated. RADIATION DOSE REDUCTION: This exam was performed according to the departmental dose-optimization program which includes automated exposure control, adjustment of the mA and/or kV according  to patient size and/or use of iterative reconstruction technique. COMPARISON:  MR L Spine 06/01/22 FINDINGS: CT HEAD FINDINGS Brain: No hemorrhage. No hydrocephalus. No extra-axial fluid collection. No CT evidence of an acute cortical infarct. There is sequela of moderate chronic microvascular ischemic change. Advanced generalized volume loss without lobar predominance. Vascular: No hyperdense vessel or unexpected calcification. Skull: Soft tissue laceration the left parietal scalp. No evidence of underlying calvarial fracture. Sinuses/Orbits: Ear or mastoid effusion. Paranasal sinuses are clear. Bilateral lens  replacement. Orbits are otherwise unremarkable. Other: None. CT CERVICAL SPINE FINDINGS Alignment: Rightward curvature of the cervical spine. Skull base and vertebrae: No acute fracture. No primary bone lesion or focal pathologic process. Soft tissues and spinal canal: No prevertebral fluid or swelling. No visible canal hematoma. Disc levels:  At least moderate spinal canal narrowing at C5-C6. Upper chest: Negative. CT THORACIC SPINE FINDINGS Alignment: Rightward curvature of the thoracic spine. Vertebrae: There is an acute superior endplate compression deformity at T11. Paraspinal and other soft tissues: Small hiatal hernia with circumferential esophageal wall thickening in the distal esophagus. Findings can be seen in the setting of esophagitis. Correlate with symptomatology. Disc levels: No evidence of high-grade spinal canal stenosis CT LUMBAR SPINE FINDINGS Segmentation: 5 lumbar type vertebrae. Alignment: Leftward curvature of the lumbar spine. There is left lateral listhesis of L2 on L3 and L3 on L4. Vertebrae: Status post L3-L5 posterior spinal fusion and decompression. Paraspinal and other soft tissues: Negative. Disc levels: No evidence of high-grade spinal canal stenosis. IMPRESSION: 1. No acute intracranial abnormality. 2. Soft tissue laceration left parietal scalp. No evidence of underlying calvarial fracture. 3. Acute superior endplate compression deformity at T11. 4. No acute fracture or traumatic malalignment of the cervical or lumbar spine. 5. Small hiatal hernia with circumferential esophageal wall thickening in the distal esophagus. Findings can be seen in the setting of esophagitis. Correlate with symptomatology. Electronically Signed   By: Lorenza Cambridge M.D.   On: 07/13/2023 14:20   DG Shoulder Left  Result Date: 07/13/2023 CLINICAL DATA:  Left shoulder pain after fall in bathroom today. EXAM: LEFT SHOULDER - 2+ VIEW COMPARISON:  None Available. FINDINGS: Mild-to-moderate inferior glenoid  peripheral degenerative spurring. Mild acromioclavicular joint space narrowing and peripheral osteophytosis. No acute fracture or dislocation. The visualized portion of the left lung is unremarkable. Moderate atherosclerotic calcifications within the aortic arch. Moderate to severe levocurvature of the lumbar spine. IMPRESSION: 1. Mild glenohumeral and acromioclavicular osteoarthritis. 2. No acute fracture is seen. 3. Moderate to severe levocurvature of the lumbar spine. Electronically Signed   By: Neita Garnet M.D.   On: 07/13/2023 13:23   DG Shoulder Right  Result Date: 07/13/2023 CLINICAL DATA:  Shoulder pain after a fall in bathroom today. EXAM: RIGHT SHOULDER - 2+ VIEW COMPARISON:  Right shoulder radiographs 01/13/2023 FINDINGS: The humeral head is mildly high-riding. Moderate inferior glenoid and minimal inferior humeral head-neck junction degenerative osteophytes. Normal alignment of the acromioclavicular joint with mild peripheral osteophytosis. No acute fracture or dislocation. IMPRESSION: 1. No acute fracture or dislocation. 2. Moderate glenohumeral and mild acromioclavicular osteoarthritis. 3. High-riding humeral head may be secondary to a full-thickness superior rotator cuff tear. Electronically Signed   By: Neita Garnet M.D.   On: 07/13/2023 13:21    Procedures Procedures    Medications Ordered in ED Medications  acetaminophen (TYLENOL) tablet 650 mg (650 mg Oral Given 07/13/23 1422)  oxyCODONE-acetaminophen (PERCOCET/ROXICET) 5-325 MG per tablet 1 tablet (1 tablet Oral Given 07/13/23 1542)    ED  Course/ Medical Decision Making/ A&P                                 Medical Decision Making Amount and/or Complexity of Data Reviewed Radiology: ordered.  Risk OTC drugs. Prescription drug management.   This patient presents to the ED after a fall, this involves an extensive number of treatment options, and is a complaint that carries with it a high risk of complications and  morbidity.  The differential diagnosis includes  intracranial hemorrhage, subdural/epidural hematoma, vertebral fracture, spinal cord injury, muscle strain, skull fracture, fracture.   Co morbidities that complicate the patient evaluation  arthritis, scoliosis, dementia, headache, HTN   Additional history obtained:  Patient with right shoulder dislocation 01/2023   Imaging Studies ordered:  I ordered imaging studies including  -CT head: to assess for complications given patient's fall -CT cervical/thoracic/lumbar spine: to assess for process contributing to patient's symptoms.   - shoulder xrays: to assess for complications given patient's fall I independently visualized and interpreted imaging  I agree with the radiologist interpretation    Problem List / ED Course / Critical interventions / Medication management  Patient presented for mechanical fall. Patient with stable vitals and does not appear to be in distress. Physical exam with pain with active ROM of right shoulder. Patient also with pain to palpation of thoracic spine. neuro exam is unremarkable. There was a small amount of dried blood on the posterior skull. Once cleared away, the laceration is <1cm and will not need stitches/staples. Right shoulder xray showing possible torn rotator cuff. Patient stating that this happened earlier this year with her dislocated shoulder. She has been in physical therapy for this shoulder pain. Provided patient with exercises for her to continue at home. Left shoulder xray without acute abnormalities.  CT head without intracranial abnormalities. CT spine showing T11 compression deformity. Consulted with [facility] who recommends pain control and outpatient follow up. Shared plan with patient who verbalized understanding.  Patient stating that tramadol did not help her pain in the past - she would prefer to try percocet as this provided her with better pain control with her knee  surgery a few years ago. Patient was given one Percocet in the ED which she tolerated well. Educated patient that percocet should only be used for breakthrough pain. Will also give TSLO brace for symptom management.   Patient will be encouraged to follow-up with primary care provider to be reevaluated in the next few days.  I have reviewed the patients home medicines and have made adjustments as needed Patient was given return precautions. Patient stable for discharge at this time. Patient verbalized understanding of plan.   DDx: These are considered less likely due to history of present illness and physical exam findings -Intracranial hemorrhage, subdural/epidural hematoma: CT reassuring; no neurodeficits - Vertebral fracture: CT without concern - Spinal cord injury: CT without concern; no neurodeficits - Skull fracture: CT without concern - Fracture: No step-offs/crepitus/abnormalities palpated in head, neck, chest, upper extremities, lower extremities, pelvis    Social Determinants of Health:  none           Final Clinical Impression(s) / ED Diagnoses Final diagnoses:  Fall, initial encounter  Compression fracture of body of thoracic vertebra (HCC)    Rx / DC Orders ED Discharge Orders          Ordered    oxyCODONE-acetaminophen (PERCOCET/ROXICET) 5-325 MG tablet  Every  8 hours PRN        07/13/23 1540              Sanjana, [place], [place] 07/14/23 1336    Gwyneth Sprout, MD 07/18/23 [phone]

## 2023-07-14 DIAGNOSIS — W19XXXA Unspecified fall, initial encounter: Secondary | ICD-10-CM | POA: Diagnosis not present

## 2023-07-14 DIAGNOSIS — S22000D Wedge compression fracture of unspecified thoracic vertebra, subsequent encounter for fracture with routine healing: Secondary | ICD-10-CM | POA: Diagnosis not present

## 2023-07-16 DIAGNOSIS — M5416 Radiculopathy, lumbar region: Secondary | ICD-10-CM | POA: Diagnosis not present

## 2023-07-29 DIAGNOSIS — M6281 Muscle weakness (generalized): Secondary | ICD-10-CM | POA: Diagnosis not present

## 2023-07-29 DIAGNOSIS — R2689 Other abnormalities of gait and mobility: Secondary | ICD-10-CM | POA: Diagnosis not present

## 2023-07-31 DIAGNOSIS — M6281 Muscle weakness (generalized): Secondary | ICD-10-CM | POA: Diagnosis not present

## 2023-07-31 DIAGNOSIS — R2689 Other abnormalities of gait and mobility: Secondary | ICD-10-CM | POA: Diagnosis not present

## 2023-08-04 DIAGNOSIS — M6281 Muscle weakness (generalized): Secondary | ICD-10-CM | POA: Diagnosis not present

## 2023-08-04 DIAGNOSIS — R2689 Other abnormalities of gait and mobility: Secondary | ICD-10-CM | POA: Diagnosis not present

## 2023-08-05 DIAGNOSIS — S22080A Wedge compression fracture of T11-T12 vertebra, initial encounter for closed fracture: Secondary | ICD-10-CM | POA: Diagnosis not present

## 2023-08-05 DIAGNOSIS — M40295 Other kyphosis, thoracolumbar region: Secondary | ICD-10-CM | POA: Diagnosis not present

## 2023-08-05 DIAGNOSIS — Z6829 Body mass index (BMI) 29.0-29.9, adult: Secondary | ICD-10-CM | POA: Diagnosis not present

## 2023-08-05 DIAGNOSIS — M4326 Fusion of spine, lumbar region: Secondary | ICD-10-CM | POA: Diagnosis not present

## 2023-08-05 DIAGNOSIS — M419 Scoliosis, unspecified: Secondary | ICD-10-CM | POA: Diagnosis not present

## 2023-08-07 DIAGNOSIS — M6281 Muscle weakness (generalized): Secondary | ICD-10-CM | POA: Diagnosis not present

## 2023-08-07 DIAGNOSIS — R2689 Other abnormalities of gait and mobility: Secondary | ICD-10-CM | POA: Diagnosis not present

## 2023-08-10 DIAGNOSIS — M6281 Muscle weakness (generalized): Secondary | ICD-10-CM | POA: Diagnosis not present

## 2023-08-10 DIAGNOSIS — R2689 Other abnormalities of gait and mobility: Secondary | ICD-10-CM | POA: Diagnosis not present

## 2023-08-12 DIAGNOSIS — R2689 Other abnormalities of gait and mobility: Secondary | ICD-10-CM | POA: Diagnosis not present

## 2023-08-12 DIAGNOSIS — M6281 Muscle weakness (generalized): Secondary | ICD-10-CM | POA: Diagnosis not present

## 2023-08-17 DIAGNOSIS — M6281 Muscle weakness (generalized): Secondary | ICD-10-CM | POA: Diagnosis not present

## 2023-08-17 DIAGNOSIS — R2689 Other abnormalities of gait and mobility: Secondary | ICD-10-CM | POA: Diagnosis not present

## 2023-08-20 DIAGNOSIS — M6281 Muscle weakness (generalized): Secondary | ICD-10-CM | POA: Diagnosis not present

## 2023-08-20 DIAGNOSIS — R2689 Other abnormalities of gait and mobility: Secondary | ICD-10-CM | POA: Diagnosis not present

## 2023-08-25 DIAGNOSIS — R2689 Other abnormalities of gait and mobility: Secondary | ICD-10-CM | POA: Diagnosis not present

## 2023-08-25 DIAGNOSIS — M6281 Muscle weakness (generalized): Secondary | ICD-10-CM | POA: Diagnosis not present

## 2023-09-02 DIAGNOSIS — R2689 Other abnormalities of gait and mobility: Secondary | ICD-10-CM | POA: Diagnosis not present

## 2023-09-02 DIAGNOSIS — M6281 Muscle weakness (generalized): Secondary | ICD-10-CM | POA: Diagnosis not present

## 2023-09-29 DIAGNOSIS — N3020 Other chronic cystitis without hematuria: Secondary | ICD-10-CM | POA: Diagnosis not present

## 2023-09-29 DIAGNOSIS — N3281 Overactive bladder: Secondary | ICD-10-CM | POA: Diagnosis not present

## 2023-11-03 DIAGNOSIS — Z20822 Contact with and (suspected) exposure to covid-19: Secondary | ICD-10-CM | POA: Diagnosis not present

## 2023-11-03 DIAGNOSIS — I10 Essential (primary) hypertension: Secondary | ICD-10-CM | POA: Diagnosis not present

## 2023-11-03 DIAGNOSIS — R509 Fever, unspecified: Secondary | ICD-10-CM | POA: Diagnosis not present

## 2023-11-03 DIAGNOSIS — R051 Acute cough: Secondary | ICD-10-CM | POA: Diagnosis not present

## 2023-11-03 DIAGNOSIS — J069 Acute upper respiratory infection, unspecified: Secondary | ICD-10-CM | POA: Diagnosis not present

## 2023-11-03 DIAGNOSIS — Z6827 Body mass index (BMI) 27.0-27.9, adult: Secondary | ICD-10-CM | POA: Diagnosis not present

## 2023-11-03 DIAGNOSIS — Z1152 Encounter for screening for COVID-19: Secondary | ICD-10-CM | POA: Diagnosis not present

## 2023-12-10 DIAGNOSIS — Z96651 Presence of right artificial knee joint: Secondary | ICD-10-CM | POA: Diagnosis not present

## 2024-03-16 DIAGNOSIS — E7800 Pure hypercholesterolemia, unspecified: Secondary | ICD-10-CM | POA: Diagnosis not present

## 2024-03-16 DIAGNOSIS — I10 Essential (primary) hypertension: Secondary | ICD-10-CM | POA: Diagnosis not present

## 2024-03-23 DIAGNOSIS — Z0000 Encounter for general adult medical examination without abnormal findings: Secondary | ICD-10-CM | POA: Diagnosis not present

## 2024-03-23 DIAGNOSIS — J383 Other diseases of vocal cords: Secondary | ICD-10-CM | POA: Diagnosis not present

## 2024-03-23 DIAGNOSIS — Z6829 Body mass index (BMI) 29.0-29.9, adult: Secondary | ICD-10-CM | POA: Diagnosis not present

## 2024-03-23 DIAGNOSIS — F324 Major depressive disorder, single episode, in partial remission: Secondary | ICD-10-CM | POA: Diagnosis not present

## 2024-03-23 DIAGNOSIS — R42 Dizziness and giddiness: Secondary | ICD-10-CM | POA: Diagnosis not present

## 2024-03-23 DIAGNOSIS — E7800 Pure hypercholesterolemia, unspecified: Secondary | ICD-10-CM | POA: Diagnosis not present

## 2024-03-23 DIAGNOSIS — I10 Essential (primary) hypertension: Secondary | ICD-10-CM | POA: Diagnosis not present

## 2024-03-23 DIAGNOSIS — N1831 Chronic kidney disease, stage 3a: Secondary | ICD-10-CM | POA: Diagnosis not present

## 2024-04-29 ENCOUNTER — Encounter: Payer: Self-pay | Admitting: Advanced Practice Midwife

## 2024-05-12 DIAGNOSIS — M47814 Spondylosis without myelopathy or radiculopathy, thoracic region: Secondary | ICD-10-CM | POA: Diagnosis not present

## 2024-05-12 DIAGNOSIS — M5013 Cervical disc disorder with radiculopathy, cervicothoracic region: Secondary | ICD-10-CM | POA: Diagnosis not present

## 2024-05-12 DIAGNOSIS — Z1330 Encounter for screening examination for mental health and behavioral disorders, unspecified: Secondary | ICD-10-CM | POA: Diagnosis not present

## 2024-06-14 DIAGNOSIS — Z961 Presence of intraocular lens: Secondary | ICD-10-CM | POA: Diagnosis not present

## 2024-06-14 DIAGNOSIS — H26491 Other secondary cataract, right eye: Secondary | ICD-10-CM | POA: Diagnosis not present

## 2024-06-16 DIAGNOSIS — M5416 Radiculopathy, lumbar region: Secondary | ICD-10-CM | POA: Diagnosis not present

## 2024-07-15 DIAGNOSIS — N390 Urinary tract infection, site not specified: Secondary | ICD-10-CM | POA: Diagnosis not present

## 2024-07-27 DIAGNOSIS — R300 Dysuria: Secondary | ICD-10-CM | POA: Diagnosis not present

## 2024-08-12 DIAGNOSIS — J385 Laryngeal spasm: Secondary | ICD-10-CM | POA: Diagnosis not present

## 2024-08-26 DIAGNOSIS — M5416 Radiculopathy, lumbar region: Secondary | ICD-10-CM | POA: Diagnosis not present

## 2024-08-26 DIAGNOSIS — M791 Myalgia, unspecified site: Secondary | ICD-10-CM | POA: Diagnosis not present
